# Patient Record
Sex: Female | Born: 1990 | Race: Black or African American | Hispanic: No | Marital: Single | Smoking: Never smoker
Health system: Southern US, Community
[De-identification: ages and names within clinical notes are randomized; demographics above are authoritative.]

## PROBLEM LIST (undated history)

## (undated) DIAGNOSIS — J45909 Unspecified asthma, uncomplicated: Secondary | ICD-10-CM

## (undated) HISTORY — PX: ESOPHAGEAL RECONSTRUCTION: SHX1527

## (undated) HISTORY — PX: BACK SURGERY: SHX140

## (undated) HISTORY — DX: Unspecified asthma, uncomplicated: J45.909

---

## 2012-01-29 ENCOUNTER — Encounter (HOSPITAL_COMMUNITY): Payer: Self-pay | Admitting: *Deleted

## 2012-01-29 ENCOUNTER — Emergency Department (HOSPITAL_COMMUNITY)
Admission: EM | Admit: 2012-01-29 | Discharge: 2012-01-29 | Disposition: A | Discharge status: Home / Self Care | Payer: BC Managed Care – PPO | Attending: Emergency Medicine | Admitting: Emergency Medicine

## 2012-01-29 ENCOUNTER — Emergency Department (HOSPITAL_COMMUNITY): Payer: BC Managed Care – PPO

## 2012-01-29 DIAGNOSIS — R059 Cough, unspecified: Secondary | ICD-10-CM | POA: Insufficient documentation

## 2012-01-29 DIAGNOSIS — R0602 Shortness of breath: Secondary | ICD-10-CM | POA: Insufficient documentation

## 2012-01-29 DIAGNOSIS — B349 Viral infection, unspecified: Secondary | ICD-10-CM

## 2012-01-29 DIAGNOSIS — R109 Unspecified abdominal pain: Secondary | ICD-10-CM | POA: Insufficient documentation

## 2012-01-29 DIAGNOSIS — R197 Diarrhea, unspecified: Secondary | ICD-10-CM | POA: Insufficient documentation

## 2012-01-29 DIAGNOSIS — R05 Cough: Secondary | ICD-10-CM | POA: Insufficient documentation

## 2012-01-29 DIAGNOSIS — R000 Tachycardia, unspecified: Secondary | ICD-10-CM | POA: Insufficient documentation

## 2012-01-29 DIAGNOSIS — R079 Chest pain, unspecified: Secondary | ICD-10-CM | POA: Insufficient documentation

## 2012-01-29 DIAGNOSIS — B9789 Other viral agents as the cause of diseases classified elsewhere: Secondary | ICD-10-CM | POA: Insufficient documentation

## 2012-01-29 DIAGNOSIS — R1110 Vomiting, unspecified: Secondary | ICD-10-CM | POA: Insufficient documentation

## 2012-01-29 LAB — URINE MICROSCOPIC-ADD ON

## 2012-01-29 LAB — URINALYSIS, ROUTINE W REFLEX MICROSCOPIC
Bilirubin Urine: NEGATIVE
Glucose, UA: NEGATIVE mg/dL
Hgb urine dipstick: NEGATIVE
Ketones, ur: 40 mg/dL — AB
Leukocytes, UA: NEGATIVE
Protein, ur: 30 mg/dL — AB
pH: 6.5 (ref 5.0–8.0)

## 2012-01-29 LAB — POCT I-STAT, CHEM 8
Creatinine, Ser: 0.5 mg/dL (ref 0.50–1.10)
HCT: 40 % (ref 36.0–46.0)
Hemoglobin: 13.6 g/dL (ref 12.0–15.0)
Potassium: 4 mEq/L (ref 3.5–5.1)
Sodium: 142 mEq/L (ref 135–145)
TCO2: 21 mmol/L (ref 0–100)

## 2012-01-29 MED ORDER — ONDANSETRON HCL 4 MG/2ML IJ SOLN
4.0000 mg | Freq: Once | INTRAMUSCULAR | Status: AC
  Administered 2012-01-29: 4 mg via INTRAVENOUS
  Filled 2012-01-29: qty 2

## 2012-01-29 MED ORDER — KETOROLAC TROMETHAMINE 30 MG/ML IJ SOLN
30.0000 mg | Freq: Once | INTRAMUSCULAR | Status: AC
  Administered 2012-01-29: 30 mg via INTRAVENOUS
  Filled 2012-01-29: qty 1

## 2012-01-29 MED ORDER — SODIUM CHLORIDE 0.9 % IV BOLUS (SEPSIS)
1000.0000 mL | Freq: Once | INTRAVENOUS | Status: AC
  Administered 2012-01-29: 1000 mL via INTRAVENOUS

## 2012-01-29 MED ORDER — PROMETHAZINE HCL 25 MG PO TABS: 25.0000 mg | ORAL_TABLET | Freq: Four times a day (QID) | ORAL | Status: DC | PRN

## 2012-01-29 MED ORDER — SODIUM CHLORIDE 0.9 % IV SOLN
INTRAVENOUS | Status: DC
  Administered 2012-01-29: 23:00:00 via INTRAVENOUS

## 2012-01-29 MED ORDER — PROMETHAZINE HCL 25 MG/ML IJ SOLN
12.5000 mg | INTRAMUSCULAR | Status: AC
  Administered 2012-01-29: 12.5 mg via INTRAVENOUS
  Filled 2012-01-29: qty 1

## 2012-01-29 NOTE — ED Notes (Signed)
Unable to obtain IV access x2 by 2 different RN's. Paged IV team

## 2012-01-29 NOTE — ED Notes (Signed)
IV team at bedside 

## 2012-01-29 NOTE — ED Notes (Signed)
Pt states feeling somewhat better.  NP notified.

## 2012-01-29 NOTE — ED Notes (Signed)
Phenergan ordered from pharmacy 

## 2012-01-29 NOTE — Discharge Instructions (Signed)
Please read the instructions below.  You were seen in the emergency department tonight for your nausea, vomiting and diarrhea.  The likely source of your symptoms is a viral illness.  As a result of these symptoms you were quite dehydrated.  After fluids and medications for nausea you admit to feeling better.  We're prescribing medication for nausea.  Take as directed to allow you to keep well-hydrated.  When you feel you can try the eat again refer to the BR[facility] diet instructions If the diarrhea returns and persists you can get over-the-counter Immodium and take as directed.  Return for worsening symptoms otherwise we have provided the primary care referrals as discussed.  You can arrange follow up with a primary care physician of your choice.   Viral Infections A viral infection can be caused by different types of viruses.Most viral infections are not serious and resolve on their own. However, some infections may cause severe symptoms and may lead to further complications. SYMPTOMS Viruses can frequently cause:  Minor sore throat.   Aches and pains.   Headaches.   Runny nose.   Different types of rashes.   Watery eyes.   Tiredness.   Cough.   Loss of appetite.   Gastrointestinal infections, resulting in nausea, vomiting, and diarrhea.  These symptoms do not respond to antibiotics because the infection is not caused by bacteria. However, you might catch a bacterial infection following the viral infection. This is sometimes called a "superinfection." Symptoms of such a bacterial infection may include:  Worsening sore throat with pus and difficulty swallowing.   Swollen neck glands.   Chills and a high or persistent fever.   Severe headache.   Tenderness over the sinuses.   Persistent overall ill feeling (malaise), muscle aches, and tiredness (fatigue).   Persistent cough.   Yellow, green, or brown mucus production with coughing.  HOME CARE INSTRUCTIONS   Only take  over-the-counter or prescription medicines for pain, discomfort, diarrhea, or fever as directed by your caregiver.   Drink enough water and fluids to keep your urine clear or pale yellow. Sports drinks can provide valuable electrolytes, sugars, and hydration.   Get plenty of rest and maintain proper nutrition. Soups and broths with crackers or rice are fine.  SEEK IMMEDIATE MEDICAL CARE IF:   You have severe headaches, shortness of breath, chest pain, neck pain, or an unusual rash.   You have uncontrolled vomiting, diarrhea, or you are unable to keep down fluids.   You or your child has an oral temperature above 102 F (38.9 C), not controlled by medicine.   Your baby is older than 3 months with a rectal temperature of 102 F (38.9 C) or higher.   Your baby is 5 months old or younger with a rectal temperature of 100.4 F (38 C) or higher.  MAKE SURE YOU:   Understand these instructions.   Will watch your condition.   Will get help right away if you are not doing well or get worse.  Document Released: 07/25/2005 Document Revised: 10/04/2011 Document Reviewed: 02/19/2011 [facility] Patient Information 2012 [place], [place].Viral Infections A viral infection can be caused by different types of viruses.Most viral infections are not serious and resolve on their own. However, some infections may cause severe symptoms and may lead to further complications. SYMPTOMS Viruses can frequently cause:  Minor sore throat.   Aches and pains.   Headaches.   Runny nose.   Different types of rashes.   Watery eyes.  Tiredness.   Cough.   Loss of appetite.   Gastrointestinal infections, resulting in nausea, vomiting, and diarrhea.  These symptoms do not respond to antibiotics because the infection is not caused by bacteria. However, you might catch a bacterial infection following the viral infection. This is sometimes called a "superinfection." Symptoms of such a bacterial infection may  include:  Worsening sore throat with pus and difficulty swallowing.   Swollen neck glands.   Chills and a high or persistent fever.   Severe headache.   Tenderness over the sinuses.   Persistent overall ill feeling (malaise), muscle aches, and tiredness (fatigue).   Persistent cough.   Yellow, green, or brown mucus production with coughing.  HOME CARE INSTRUCTIONS   Only take over-the-counter or prescription medicines for pain, discomfort, diarrhea, or fever as directed by your caregiver.   Drink enough water and fluids to keep your urine clear or pale yellow. Sports drinks can provide valuable electrolytes, sugars, and hydration.   Get plenty of rest and maintain proper nutrition. Soups and broths with crackers or rice are fine.  SEEK IMMEDIATE MEDICAL CARE IF:   You have severe headaches, shortness of breath, chest pain, neck pain, or an unusual rash.   You have uncontrolled vomiting, diarrhea, or you are unable to keep down fluids.   You or your child has an oral temperature above 102 F (38.9 C), not controlled by medicine.   Your baby is older than 3 months with a rectal temperature of 102 F (38.9 C) or higher.   Your baby is 45 months old or younger with a rectal temperature of 100.4 F (38 C) or higher.  MAKE SURE YOU:   Understand these instructions.   Will watch your condition.   Will get help right away if you are not doing well or get worse.  Document Released: 07/25/2005 Document Revised: 10/04/2011 Document Reviewed: 02/19/2011 [facility] Patient Information 2012 [place], [place].

## 2012-01-29 NOTE — ED Notes (Signed)
Lab at bedside.  NA to bedside to help pt to RR.

## 2012-01-29 NOTE — ED Notes (Signed)
IV team aware 

## 2012-01-29 NOTE — ED Notes (Signed)
Pt reports sob and cough x 2 days, denies fevers.

## 2012-01-29 NOTE — ED Notes (Signed)
MD at bedside. 

## 2012-01-29 NOTE — ED Notes (Signed)
Pt ambulated to and from rr with steady gait. 

## 2012-01-29 NOTE — ED Provider Notes (Signed)
History     CSN: [id number]  Arrival date & time 01/29/12  1705   First MD Initiated Contact with Patient 01/29/12 1756      Chief Complaint  Patient presents with  . Shortness of Breath  . Cough    (Consider location/radiation/quality/duration/timing/severity/associated sxs/prior treatment) HPI Comments: Patient presents today with chest pain and shortness of breath.  This is been going for 2 days.  Patient is notably also had vomiting and diarrhea the last 2 days.  Family has had similar symptoms.  She denies any fevers.  She's had some mild abdominal pain.  No dysuria.  No abdominal surgeries.  No specific inciting or relieving factors.  Cough is not productive.  She has minimal cough  Patient is a 21 y.o. female presenting with shortness of breath and cough. The history is provided by the patient. No language interpreter was used.  Shortness of Breath  The current episode started yesterday. The onset was gradual. Associated symptoms include chest pain, cough and shortness of breath. Pertinent negatives include no fever.  Cough Associated symptoms include chest pain and shortness of breath. Pertinent negatives include no chills, no headaches and no eye redness.    History reviewed. No pertinent past medical history.  Past Surgical History  Procedure Date  . Esophageal reconstruction   . Back surgery     History reviewed. No pertinent family history.  History  Substance Use Topics  . Smoking status: Never Smoker   . Smokeless tobacco: Not on file  . Alcohol Use: Yes     occ    OB History    Grav Para Term Preterm Abortions TAB SAB Ect Mult Living                  Review of Systems  Constitutional: Negative.  Negative for fever and chills.  HENT: Negative.   Eyes: Negative.  Negative for discharge and redness.  Respiratory: Positive for cough and shortness of breath.   Cardiovascular: Positive for chest pain.  Gastrointestinal: Positive for nausea, vomiting,  abdominal pain and diarrhea.  Genitourinary: Negative.  Negative for dysuria and vaginal discharge.  Musculoskeletal: Negative.  Negative for back pain.  Skin: Negative.  Negative for color change and rash.  Neurological: Negative.  Negative for syncope and headaches.  Hematological: Negative.  Negative for adenopathy.  Psychiatric/Behavioral: Negative.  Negative for confusion.  All other systems reviewed and are negative.    Allergies  Review of patient's allergies indicates no known allergies.  Home Medications  No current outpatient prescriptions on file.  BP 121/86  Pulse 116  Temp(Src) 98.2 F (36.8 C) (Oral)  Resp 18  SpO2 100%  Physical Exam  Nursing note and vitals reviewed. Constitutional: She is oriented to person, place, and time. She appears well-developed and well-nourished.  Non-toxic appearance. She does not have a sickly appearance.  HENT:  Head: Normocephalic and atraumatic.  Eyes: Conjunctivae, EOM and lids are normal. Pupils are equal, round, and reactive to light. No scleral icterus.  Neck: Trachea normal and normal range of motion. Neck supple.  Cardiovascular: Regular rhythm and normal heart sounds.  Tachycardia present.   Pulmonary/Chest: Effort normal and breath sounds normal. No respiratory distress. She has no wheezes. She has no rales.  Abdominal: Soft. Normal appearance. There is no tenderness. There is no rebound, no guarding and no CVA tenderness.  Musculoskeletal: Normal range of motion.  Neurological: She is alert and oriented to person, place, and time. She has normal strength.  Skin: Skin is warm, dry and intact. No rash noted.  Psychiatric: She has a normal mood and affect. Her behavior is normal. Judgment and thought content normal.    ED Course  Procedures (including critical care time)  Dg Chest 2 View  01/29/2012  *RADIOLOGY REPORT*  Clinical Data: Chest pain and shortness of breath.  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  There are dense costochondral calcifications noted on the left side of uncertain significance or etiology.  It may be a stress-related process due to the scoliosis and subsequent surgery.  The cardiac silhouette, mediastinal and hilar contours are normal and the lungs are clear.  No pleural effusion.  IMPRESSION:  1.  No acute cardiopulmonary findings.  Original Report Authenticated By: P. Loralie Champagne, M.D.      MDM  Patient appears to have a likely viral syndrome based on her nausea, vomiting, diarrhea as well as cough and chest pain.  Her chest x-ray is negative and does not show any pneumonia.  We'll treat her with antiemetics and IV fluids to try and decrease her heart rate for rehydration.  Anticipate that it once her heart rate decreases and she hopefully symptomatically improve she'll be able to be discharged home.        Nat Christen, MD 01/29/12 [phone]

## 2017-11-29 ENCOUNTER — Emergency Department (HOSPITAL_COMMUNITY): Payer: 59

## 2017-11-29 ENCOUNTER — Encounter (HOSPITAL_COMMUNITY): Payer: Self-pay | Admitting: *Deleted

## 2017-11-29 ENCOUNTER — Other Ambulatory Visit: Payer: Self-pay

## 2017-11-29 ENCOUNTER — Observation Stay (HOSPITAL_COMMUNITY)
Admission: EM | Admit: 2017-11-29 | Discharge: 2017-12-01 | Disposition: A | Discharge status: Home / Self Care | Payer: 59 | Attending: Family Medicine | Admitting: Family Medicine

## 2017-11-29 DIAGNOSIS — J189 Pneumonia, unspecified organism: Secondary | ICD-10-CM | POA: Diagnosis not present

## 2017-11-29 DIAGNOSIS — R030 Elevated blood-pressure reading, without diagnosis of hypertension: Secondary | ICD-10-CM | POA: Diagnosis not present

## 2017-11-29 DIAGNOSIS — M419 Scoliosis, unspecified: Secondary | ICD-10-CM | POA: Diagnosis not present

## 2017-11-29 DIAGNOSIS — J181 Lobar pneumonia, unspecified organism: Secondary | ICD-10-CM

## 2017-11-29 DIAGNOSIS — R079 Chest pain, unspecified: Secondary | ICD-10-CM | POA: Diagnosis present

## 2017-11-29 DIAGNOSIS — R0682 Tachypnea, not elsewhere classified: Secondary | ICD-10-CM | POA: Insufficient documentation

## 2017-11-29 DIAGNOSIS — K5939 Other megacolon: Secondary | ICD-10-CM | POA: Insufficient documentation

## 2017-11-29 DIAGNOSIS — Z79899 Other long term (current) drug therapy: Secondary | ICD-10-CM | POA: Insufficient documentation

## 2017-11-29 DIAGNOSIS — Z981 Arthrodesis status: Secondary | ICD-10-CM | POA: Diagnosis not present

## 2017-11-29 LAB — BASIC METABOLIC PANEL
ANION GAP: 12 (ref 5–15)
BUN: 15 mg/dL (ref 6–20)
CALCIUM: 8.7 mg/dL — AB (ref 8.9–10.3)
CO2: 22 mmol/L (ref 22–32)
Chloride: 104 mmol/L (ref 101–111)
Creatinine, Ser: 0.69 mg/dL (ref 0.44–1.00)
GFR calc Af Amer: >60 mL/min (ref >60)
GFR calc non Af Amer: >60 mL/min (ref >60)
GLUCOSE: 90 mg/dL (ref 65–99)
Potassium: 3.7 mmol/L (ref 3.5–5.1)
Sodium: 138 mmol/L (ref 135–145)

## 2017-11-29 LAB — CBC
HEMATOCRIT: 35.6 % — AB (ref 36.0–46.0)
HEMOGLOBIN: 12 g/dL (ref 12.0–15.0)
MCH: 29.3 pg (ref 26.0–34.0)
MCHC: 33.7 g/dL (ref 30.0–36.0)
MCV: 86.8 fL (ref 78.0–100.0)
Platelets: 253 10*3/uL (ref 150–400)
RBC: 4.1 MIL/uL (ref 3.87–5.11)
RDW: 13.4 % (ref 11.5–15.5)
WBC: 14.9 10*3/uL — ABNORMAL HIGH (ref 4.0–10.5)

## 2017-11-29 LAB — I-STAT TROPONIN, ED: TROPONIN I, POC: 0 ng/mL (ref 0.00–0.08)

## 2017-11-29 MED ORDER — DEXTROSE 5 % IV SOLN
500.0000 mg | Freq: Once | INTRAVENOUS | Status: AC
  Administered 2017-11-29: 500 mg via INTRAVENOUS
  Filled 2017-11-29: qty 500

## 2017-11-29 MED ORDER — FENTANYL CITRATE (PF) 100 MCG/2ML IJ SOLN
50.0000 ug | Freq: Once | INTRAMUSCULAR | Status: AC
  Administered 2017-11-29: 50 ug via INTRAVENOUS
  Filled 2017-11-29: qty 2

## 2017-11-29 MED ORDER — DEXTROSE 5 % IV SOLN
1.0000 g | Freq: Once | INTRAVENOUS | Status: AC
  Administered 2017-11-29: 1 g via INTRAVENOUS
  Filled 2017-11-29: qty 10

## 2017-11-29 MED ORDER — IOPAMIDOL (ISOVUE-370) INJECTION 76%
INTRAVENOUS | Status: AC
  Administered 2017-11-29: 100 mL
  Filled 2017-11-29: qty 100

## 2017-11-29 MED ORDER — SODIUM CHLORIDE 0.9 % IV BOLUS (SEPSIS)
1000.0000 mL | Freq: Once | INTRAVENOUS | Status: AC
  Administered 2017-11-29: 1000 mL via INTRAVENOUS

## 2017-11-29 NOTE — ED Provider Notes (Signed)
[facility] EMERGENCY DEPARTMENT Provider Note   CSN: [id number] Arrival date & time: 11/29/17  1651     History   Chief Complaint Chief Complaint  Patient presents with  . Chest Pain  . Back Pain    HPI Olivia Charles is a 27 y.o. female.  The history is provided by the patient and medical records. No language interpreter was used.  Chest Pain   This is a new problem. The current episode started more than 1 week ago. The problem occurs constantly. The problem has been gradually worsening. The pain is associated with breathing and coughing. The pain is present in the substernal region. The pain is at a severity of 7/10. The pain is moderate. The quality of the pain is described as pleuritic, sharp and dull. The pain does not radiate. Duration of episode(s) is 1 week. The symptoms are aggravated by deep breathing. Associated symptoms include cough, malaise/fatigue and shortness of breath. Pertinent negatives include no abdominal pain, no back pain, no diaphoresis, no dizziness, no exertional chest pressure, no fever, no headaches, no irregular heartbeat, no lower extremity edema, no nausea, no palpitations, no sputum production, no syncope, no vomiting and no weakness. She has tried nothing for the symptoms. The treatment provided no relief. Past medical history comments: esophageal surgery    History reviewed. No pertinent past medical history.  There are no active problems to display for this patient.   Past Surgical History:  Procedure Laterality Date  . BACK SURGERY    . ESOPHAGEAL RECONSTRUCTION      OB History    No data available       Home Medications    Prior to Admission medications   Medication Sig Start Date End Date Taking? Authorizing Provider  ibuprofen (ADVIL,MOTRIN) 200 MG tablet Take 400 mg by mouth every 6 (six) hours as needed for headache (pain).   Yes [provider]  MedroxyPROGESTERone Acetate 150 MG/ML SUSY Inject 150  mg into the muscle every 3 (three) months. Next injection due 12/03/17 09/16/17  Yes [provider]  omeprazole (PRILOSEC) 20 MG capsule Take 20 mg by mouth daily. 11/21/17  Yes [provider]  Tetrahydrozoline HCl (VISINE OP) Place 1 drop into both eyes daily as needed (irritation).   Yes [provider]  valACYclovir (VALTREX) 500 MG tablet Take 500 mg by mouth daily.   Yes [provider]    Family History No family history on file.  Social History Social History   Tobacco Use  . Smoking status: Never Smoker  Substance Use Topics  . Alcohol use: Yes    Comment: occ  . Drug use: Not on file     Allergies   Patient has no known allergies.   Review of Systems Review of Systems  Constitutional: Positive for chills and malaise/fatigue. Negative for diaphoresis, fatigue and fever.  HENT: Negative for congestion.   Eyes: Negative for visual disturbance.  Respiratory: Positive for cough and shortness of breath. Negative for sputum production, choking, chest tightness, wheezing and stridor.   Cardiovascular: Positive for chest pain. Negative for palpitations, leg swelling and syncope.  Gastrointestinal: Negative for abdominal pain, nausea and vomiting.  Genitourinary: Negative for dysuria and flank pain.  Musculoskeletal: Negative for back pain and neck pain.  Skin: Negative for rash and wound.  Neurological: Negative for dizziness, weakness, light-headedness and headaches.  Psychiatric/Behavioral: Negative for agitation.  All other systems reviewed and are negative.    Physical Exam Updated Vital  Signs BP (!) 131/99 (BP Location: Right Arm)   Pulse 98   Temp 98.7 F (37.1 C) (Oral)   Resp 16   Ht 4\' 7"  (1.397 m)   Wt 88 kg (194 lb)   SpO2 99%   BMI 45.09 kg/m   Physical Exam  Constitutional: She is oriented to person, place, and time. She appears well-developed and well-nourished.  Non-toxic appearance. She does not appear ill. No  distress.  HENT:  Head: Normocephalic.  Mouth/Throat: Oropharynx is clear and moist. No oropharyngeal exudate.  Eyes: Conjunctivae and EOM are normal. Pupils are equal, round, and reactive to light.  Neck: Normal range of motion. Neck supple.  Cardiovascular: Intact distal pulses. Tachycardia present.  No murmur heard. Pulmonary/Chest: Effort normal. No stridor. No respiratory distress. She has no wheezes. She has rhonchi. She has no rales. She exhibits no tenderness.  Abdominal: Soft. Bowel sounds are normal. She exhibits no distension. There is no tenderness. There is no guarding.  Musculoskeletal: She exhibits no edema or tenderness.  Neurological: She is alert and oriented to person, place, and time. No sensory deficit. She exhibits normal muscle tone.  Skin: Capillary refill takes less than 2 seconds. No rash noted. She is not diaphoretic. No erythema.  Psychiatric: She has a normal mood and affect.  Nursing note and vitals reviewed.    ED Treatments / Results  Labs (all labs ordered are listed, but only abnormal results are displayed) Labs Reviewed  BASIC METABOLIC PANEL - Abnormal; Notable for the following components:      Result Value   Calcium 8.7 (*)    All other components within normal limits  CBC - Abnormal; Notable for the following components:   WBC 14.9 (*)    HCT 35.6 (*)    All other components within normal limits  CULTURE, BLOOD (ROUTINE X 2)  CULTURE, BLOOD (ROUTINE X 2)  CULTURE, EXPECTORATED SPUTUM-ASSESSMENT  GRAM STAIN  PROCALCITONIN  HIV ANTIBODY (ROUTINE TESTING)  STREP PNEUMONIAE URINARY ANTIGEN  COMPREHENSIVE METABOLIC PANEL  CBC WITH DIFFERENTIAL/PLATELET  LEGIONELLA PNEUMOPHILA SEROGP 1 UR AG  PROCALCITONIN  I-STAT TROPONIN, ED  I-STAT BETA HCG BLOOD, ED ([facility], [facility], [facility] ONLY)  POC URINE PREG, ED    EKG  EKG Interpretation  Date/Time:  Friday November 29 2017 22:25:30 EST Ventricular Rate:  97 PR Interval:    QRS Duration: 86 QT  Interval:  364 QTC Calculation: 463 R Axis:   45 Text Interpretation:  Sinus rhythm Borderline T abnormalities, anterior leads No prior ECG for comparison.  No STEMI Confirmed by Theda Belfastegeler, Chris ([id number]) on 11/30/2017 3:31:25 AM       Radiology Dg Chest 2 View  Result Date: 11/29/2017 CLINICAL DATA:  Chest pain with onset 1 week ago. EXAM: CHEST  2 VIEW COMPARISON:  01/29/2012 FINDINGS: Cardiomediastinal silhouette is normal. Mediastinal contours appear intact. There is no evidence of pleural effusion or pneumothorax. Airspace consolidation in the inferior aspect of the left upper lobe/left middle lobe. Gas soft tissue levels seen on the lateral view. Osseous structures are without acute abnormality. Prior posterior spinal fusion. Soft tissues are grossly normal. IMPRESSION: Airspace consolidation in the mid left thorax with gas soft tissue levels seen on the lateral view. This may represent cavitating pneumonia or other space-occupying lesion. Further evaluation with chest CT with contrast may be considered. Electronically Signed   By: Ted Mcalpineobrinka  Dimitrova M.D.   On: 11/29/2017 17:38   Ct Angio Chest Pe W And/or Wo Contrast  Result Date: 11/29/2017  CLINICAL DATA:  Chest pain, shortness of Breath. Remote history of esophageal reconstruction. EXAM: CT ANGIOGRAPHY CHEST WITH CONTRAST TECHNIQUE: Multidetector CT imaging of the chest was performed using the standard protocol during bolus administration of intravenous contrast. Multiplanar CT image reconstructions and MIPs were obtained to evaluate the vascular anatomy. CONTRAST:  ISOVUE-370 IOPAMIDOL (ISOVUE-370) INJECTION 76% COMPARISON:  None. FINDINGS: Cardiovascular: Heart is normal size. Aorta is normal caliber. No filling defects in the pulmonary arteries to suggest pulmonary emboli. Mediastinum/Nodes: No adenopathy. The esophagus abnormal appearing. The fluid-filled. Possible diverticulum off the distal esophagus, measuring 1.9 cm on image 87.  Esophagus is difficult to follow in the upper thoracic region. Lungs/Pleura: Gas collection noted in the anterior left hemithorax. On the coronal views, this appears to represent colon passing through a very small anterior diaphragmatic defect seen best on sagittal image 83. The air-fluid level seen on chest x-ray were in this herniated colon segment. When comparing to prior chest x-ray 01/29/2012, there appear to be colon within the chest on that study this was difficult to visualize. This presumably represents the esophageal reconstruction reported by the patient. Areas of nodular ground-glass consolidation throughout the left lower lobe compatible with pneumonia or aspiration pneumonia. Right lung is clear. No effusions. Upper Abdomen: Although difficult to see, anterior diaphragmatic noted noted on the left at approximately image 107 of the axial images. No acute findings in the upper abdomen. Musculoskeletal: There is gas tracking from the colon within the upper left hemithorax which tracks superiorly anterior to the left innominate vein and tracks into the left side of the neck. This presumably is part of the esophageal reconstruction with the colon which likely ties into the left side of the cervical esophagus. Severe thoracolumbar scoliosis. Posterior spinal rods noted in the thoracic and visualized lumbar spine. Review of the MIP images confirms the above findings. IMPRESSION: The air-fluid level seen on today's chest x-ray appear to be within colon in the left anterior hemithorax. Gas and stool filled loop of colon extend from the anterior left hemidiaphragm superiorly to the left apex. In retrospect, this herniated colon was likely present in the left chest on remote chest x-ray from 01/29/2012. In addition, gas extends from the anterior left hemithorax and the herniated colon superiorly in the left side of the neck. The these findings presumably represent the esophageal reconstruction with a colon  segment and layering food material dependently in the dilated colon. Abnormal appearance of the esophagus which appears thick walled in some areas and is fluid-filled and dilated throughout its course in the chest. Distally, there is fluid adjacent to the distal esophagus measuring 1.9 cm which may reflect a distal esophageal diverticulum. Consolidation noted in the left lower lobe compatible with pneumonia. Cannot exclude aspiration pneumonia. These results were called by telephone at the time of interpretation on 11/29/2017 at 11:12 pm to Dr. Lynden Oxford , who verbally acknowledged these results. Electronically Signed   By: Charlett Nose M.D.   On: 11/29/2017 23:12    Procedures Procedures (including critical care time)  Medications Ordered in ED Medications  pantoprazole (PROTONIX) EC tablet 40 mg (not administered)  tetrahydrozoline 0.05 % ophthalmic solution 2 drop (not administered)  valACYclovir (VALTREX) tablet 500 mg (not administered)  enoxaparin (LOVENOX) injection 40 mg (not administered)  sodium chloride flush (NS) 0.9 % injection 3 mL (3 mLs Intravenous Given 11/30/17 0100)  sodium chloride flush (NS) 0.9 % injection 3 mL (not administered)  0.9 %  sodium chloride infusion (not administered)  acetaminophen (TYLENOL) tablet 650 mg (not administered)    Or  acetaminophen (TYLENOL) suppository 650 mg (not administered)  HYDROcodone-acetaminophen (NORCO/VICODIN) 5-325 MG per tablet 1-2 tablet (not administered)  senna-docusate (Senokot-S) tablet 1 tablet (not administered)  bisacodyl (DULCOLAX) EC tablet 5 mg (not administered)  ondansetron (ZOFRAN) tablet 4 mg (not administered)    Or  ondansetron (ZOFRAN) injection 4 mg (not administered)  cefTRIAXone (ROCEPHIN) 1 g in dextrose 5 % 50 mL IVPB (not administered)  azithromycin (ZITHROMAX) 500 mg in dextrose 5 % 250 mL IVPB (not administered)  famotidine (PEPCID) IVPB 20 mg premix (20 mg Intravenous New Bag/Given 11/30/17 0326)    metroNIDAZOLE (FLAGYL) IVPB 500 mg (0 mg Intravenous Stopped 11/30/17 0250)  fentaNYL (SUBLIMAZE) injection 50 mcg (not administered)  hydrALAZINE (APRESOLINE) injection 10 mg (not administered)  sodium chloride 0.9 % bolus 1,000 mL (1,000 mLs Intravenous New Bag/Given 11/29/17 2204)  fentaNYL (SUBLIMAZE) injection 50 mcg (50 mcg Intravenous Given 11/29/17 2207)  iopamidol (ISOVUE-370) 76 % injection (100 mLs  Contrast Given 11/29/17 2211)  cefTRIAXone (ROCEPHIN) 1 g in dextrose 5 % 50 mL IVPB (0 g Intravenous Stopped 11/30/17 0028)  azithromycin (ZITHROMAX) 500 mg in dextrose 5 % 250 mL IVPB (0 mg Intravenous Stopped 11/30/17 0100)     Initial Impression / Assessment and Plan / ED Course  I have reviewed the triage vital signs and the nursing notes.  Pertinent labs & imaging results that were available during my care of the patient were reviewed by me and considered in my medical decision making (see chart for details).     Olivia Charles is a 27 y.o. female with a past medical history significant for scoliosis status post back surgery, esophageal reconstruction status post surgery, and chronic back pain who presents with chest pain.  Patient reports that she is been having mid sternal chest pain for the last week.  She reports that she went to urgent care several days ago and was started on steroids and omeprazole.  Patient was instructed to follow-up with PCP and observe return precautions.  Patient says she is continued to have worsening chest pain and cough and chills.  On arrival, patient was found to be tachycardic.  Patient had coarse breath sounds on exam.  Patient's chest was nontender to palpation.  EKG showed no STEMI.  Chest x-ray showed concern for cavitary pneumonia versus other mass.  CT was obtained to rule out pulmonary embolism versus atypical pneumonia.  CT scan showed pneumonia but also the patient's prior esophageal surgery.  Do not feel patient has a problem with esophageal  surgery at this time however patient was started on antibiotics.  Blood cultures obtained.  Next  Given the patient's atypical anatomy and ongoing pneumonia, patient will be admitted for further management.  Patient admitted in stable condition for further management of pneumonia.  If patient develops any severe worsening or other problems, anticipate speaking with cardiothoracic surgery to evaluate the patient's esophagus.   Final Clinical Impressions(s) / ED Diagnoses   Final diagnoses:  Community acquired pneumonia of left lower lobe of lung (HCC)     Clinical Impression: 1. Community acquired pneumonia of left lower lobe of lung (HCC)     Disposition: Admit  This note was prepared with assistance of Conservation officer, historic buildings. Occasional wrong-word or sound-a-like substitutions may have occurred due to the inherent limitations of voice recognition software.      Tahje Borawski, Canary Brim, MD 11/30/17 [phone]

## 2017-11-29 NOTE — ED Triage Notes (Signed)
Pt c/o mid CP onset x 1 wk that was evaluated at UC last wk with rx of omeprazole, pt reports continued pain that radiates to mid upper back and SOB, pt denies n/v/d, pt denies htn with BP elevated in triage 141/104 R arm, 197/104 L arm, pt A&O x4

## 2017-11-30 DIAGNOSIS — J181 Lobar pneumonia, unspecified organism: Secondary | ICD-10-CM

## 2017-11-30 DIAGNOSIS — R079 Chest pain, unspecified: Secondary | ICD-10-CM

## 2017-11-30 DIAGNOSIS — R030 Elevated blood-pressure reading, without diagnosis of hypertension: Secondary | ICD-10-CM

## 2017-11-30 LAB — CBC WITH DIFFERENTIAL/PLATELET
BASOS ABS: 0 10*3/uL (ref 0.0–0.1)
Basophils Relative: 0 %
EOS ABS: 0.1 10*3/uL (ref 0.0–0.7)
Eosinophils Relative: 1 %
HCT: 33.7 % — ABNORMAL LOW (ref 36.0–46.0)
Hemoglobin: 11.5 g/dL — ABNORMAL LOW (ref 12.0–15.0)
LYMPHS PCT: 27 %
Lymphs Abs: 3.3 10*3/uL (ref 0.7–4.0)
MCH: 29.9 pg (ref 26.0–34.0)
MCHC: 34.1 g/dL (ref 30.0–36.0)
MCV: 87.5 fL (ref 78.0–100.0)
Monocytes Absolute: 0.4 10*3/uL (ref 0.1–1.0)
Monocytes Relative: 3 %
Neutro Abs: 8.2 10*3/uL — ABNORMAL HIGH (ref 1.7–7.7)
Neutrophils Relative %: 69 %
PLATELETS: 256 10*3/uL (ref 150–400)
RBC: 3.85 MIL/uL — AB (ref 3.87–5.11)
RDW: 13.8 % (ref 11.5–15.5)
WBC: 11.9 10*3/uL — AB (ref 4.0–10.5)

## 2017-11-30 LAB — COMPREHENSIVE METABOLIC PANEL
ALT: 13 U/L — ABNORMAL LOW (ref 14–54)
AST: 16 U/L (ref 15–41)
Albumin: 3.3 g/dL — ABNORMAL LOW (ref 3.5–5.0)
Alkaline Phosphatase: 49 U/L (ref 38–126)
Anion gap: 11 (ref 5–15)
BILIRUBIN TOTAL: 0.5 mg/dL (ref 0.3–1.2)
BUN: 11 mg/dL (ref 6–20)
CO2: 22 mmol/L (ref 22–32)
Calcium: 8.5 mg/dL — ABNORMAL LOW (ref 8.9–10.3)
Chloride: 104 mmol/L (ref 101–111)
Creatinine, Ser: 0.6 mg/dL (ref 0.44–1.00)
GFR calc Af Amer: >60 mL/min (ref >60)
Glucose, Bld: 84 mg/dL (ref 65–99)
POTASSIUM: 3.7 mmol/L (ref 3.5–5.1)
Sodium: 137 mmol/L (ref 135–145)
TOTAL PROTEIN: 5.8 g/dL — AB (ref 6.5–8.1)

## 2017-11-30 LAB — PROCALCITONIN: Procalcitonin: <0.1 ng/mL

## 2017-11-30 LAB — STREP PNEUMONIAE URINARY ANTIGEN: Strep Pneumo Urinary Antigen: NEGATIVE

## 2017-11-30 MED ORDER — METRONIDAZOLE IN NACL 5-0.79 MG/ML-% IV SOLN
500.0000 mg | Freq: Three times a day (TID) | INTRAVENOUS | Status: DC
  Administered 2017-11-30 (×2): 500 mg via INTRAVENOUS
  Filled 2017-11-30 (×3): qty 100

## 2017-11-30 MED ORDER — DEXTROSE 5 % IV SOLN
1.0000 g | INTRAVENOUS | Status: DC
  Administered 2017-11-30: 1 g via INTRAVENOUS
  Filled 2017-11-30: qty 10

## 2017-11-30 MED ORDER — ACETAMINOPHEN 325 MG PO TABS
650.0000 mg | ORAL_TABLET | Freq: Four times a day (QID) | ORAL | Status: DC | PRN
  Administered 2017-11-30: 650 mg via ORAL
  Filled 2017-11-30 (×2): qty 2

## 2017-11-30 MED ORDER — ALBUTEROL SULFATE (2.5 MG/3ML) 0.083% IN NEBU
2.5000 mg | INHALATION_SOLUTION | Freq: Once | RESPIRATORY_TRACT | Status: AC
  Administered 2017-11-30: 2.5 mg via RESPIRATORY_TRACT
  Filled 2017-11-30: qty 3

## 2017-11-30 MED ORDER — SODIUM CHLORIDE 0.9% FLUSH
3.0000 mL | Freq: Two times a day (BID) | INTRAVENOUS | Status: DC
  Administered 2017-11-30 – 2017-12-01 (×3): 3 mL via INTRAVENOUS

## 2017-11-30 MED ORDER — HYDROCODONE-ACETAMINOPHEN 5-325 MG PO TABS
1.0000 | ORAL_TABLET | ORAL | Status: DC | PRN
  Administered 2017-12-01: 2 via ORAL
  Filled 2017-11-30: qty 2
  Filled 2017-11-30: qty 1

## 2017-11-30 MED ORDER — FAMOTIDINE 20 MG PO TABS
20.0000 mg | ORAL_TABLET | Freq: Two times a day (BID) | ORAL | Status: DC
  Administered 2017-11-30 – 2017-12-01 (×2): 20 mg via ORAL
  Filled 2017-11-30 (×3): qty 1

## 2017-11-30 MED ORDER — BISACODYL 5 MG PO TBEC: 5.0000 mg | DELAYED_RELEASE_TABLET | Freq: Every day | ORAL | Status: DC | PRN

## 2017-11-30 MED ORDER — FENTANYL CITRATE (PF) 100 MCG/2ML IJ SOLN: 50.0000 ug | INTRAMUSCULAR | Status: DC | PRN

## 2017-11-30 MED ORDER — HYDRALAZINE HCL 20 MG/ML IJ SOLN: 10.0000 mg | INTRAMUSCULAR | Status: DC | PRN

## 2017-11-30 MED ORDER — PANTOPRAZOLE SODIUM 40 MG PO TBEC
40.0000 mg | DELAYED_RELEASE_TABLET | Freq: Every day | ORAL | Status: DC
  Administered 2017-11-30 – 2017-12-01 (×2): 40 mg via ORAL
  Filled 2017-11-30 (×2): qty 1

## 2017-11-30 MED ORDER — ACETAMINOPHEN 650 MG RE SUPP: 650.0000 mg | Freq: Four times a day (QID) | RECTAL | Status: DC | PRN

## 2017-11-30 MED ORDER — SODIUM CHLORIDE 0.9% FLUSH: 3.0000 mL | INTRAVENOUS | Status: DC | PRN

## 2017-11-30 MED ORDER — ONDANSETRON HCL 4 MG PO TABS: 4.0000 mg | ORAL_TABLET | Freq: Four times a day (QID) | ORAL | Status: DC | PRN

## 2017-11-30 MED ORDER — AZITHROMYCIN 500 MG PO TABS
500.0000 mg | ORAL_TABLET | Freq: Every day | ORAL | Status: DC
  Administered 2017-11-30: 500 mg via ORAL
  Filled 2017-11-30 (×2): qty 1

## 2017-11-30 MED ORDER — GUAIFENESIN ER 600 MG PO TB12
600.0000 mg | ORAL_TABLET | Freq: Two times a day (BID) | ORAL | Status: DC
  Administered 2017-11-30 – 2017-12-01 (×3): 600 mg via ORAL
  Filled 2017-11-30 (×3): qty 1

## 2017-11-30 MED ORDER — METRONIDAZOLE 500 MG PO TABS
500.0000 mg | ORAL_TABLET | Freq: Three times a day (TID) | ORAL | Status: DC
  Administered 2017-11-30 – 2017-12-01 (×3): 500 mg via ORAL
  Filled 2017-11-30 (×3): qty 1

## 2017-11-30 MED ORDER — TETRAHYDROZOLINE HCL 0.05 % OP SOLN
2.0000 [drp] | Freq: Every day | OPHTHALMIC | Status: DC | PRN
Start: 2017-11-30 — End: 2017-12-01

## 2017-11-30 MED ORDER — ONDANSETRON HCL 4 MG/2ML IJ SOLN: 4.0000 mg | Freq: Four times a day (QID) | INTRAMUSCULAR | Status: DC | PRN

## 2017-11-30 MED ORDER — VALACYCLOVIR HCL 500 MG PO TABS
500.0000 mg | ORAL_TABLET | Freq: Every day | ORAL | Status: DC
  Administered 2017-11-30 – 2017-12-01 (×2): 500 mg via ORAL
  Filled 2017-11-30 (×2): qty 1

## 2017-11-30 MED ORDER — ENOXAPARIN SODIUM 40 MG/0.4ML ~~LOC~~ SOLN
40.0000 mg | Freq: Every day | SUBCUTANEOUS | Status: DC
  Administered 2017-11-30 – 2017-12-01 (×2): 40 mg via SUBCUTANEOUS
  Filled 2017-11-30 (×2): qty 0.4

## 2017-11-30 MED ORDER — FAMOTIDINE IN NACL 20-0.9 MG/50ML-% IV SOLN
20.0000 mg | Freq: Two times a day (BID) | INTRAVENOUS | Status: DC
  Administered 2017-11-30 (×2): 20 mg via INTRAVENOUS
  Filled 2017-11-30 (×2): qty 50

## 2017-11-30 MED ORDER — ALBUTEROL SULFATE (2.5 MG/3ML) 0.083% IN NEBU
2.5000 mg | INHALATION_SOLUTION | RESPIRATORY_TRACT | Status: DC | PRN
  Administered 2017-11-30: 2.5 mg via RESPIRATORY_TRACT
  Filled 2017-11-30: qty 3

## 2017-11-30 MED ORDER — SODIUM CHLORIDE 0.9 % IV SOLN: 250.0000 mL | INTRAVENOUS | Status: DC | PRN

## 2017-11-30 MED ORDER — SENNOSIDES-DOCUSATE SODIUM 8.6-50 MG PO TABS: 1.0000 | ORAL_TABLET | Freq: Every evening | ORAL | Status: DC | PRN

## 2017-11-30 MED ORDER — DEXTROSE 5 % IV SOLN: 500.0000 mg | INTRAVENOUS | Status: DC

## 2017-11-30 NOTE — Plan of Care (Signed)
  Nutrition: Adequate nutrition will be maintained 11/30/2017 1213 - Progressing by Darrow BussingArcilla, Navon Kotowski M, RN   Coping: Level of anxiety will decrease 11/30/2017 1213 - Progressing by Darrow BussingArcilla, Avanell Banwart M, RN   Elimination: Will not experience complications related to bowel motility 11/30/2017 1213 - Progressing by Darrow BussingArcilla, Royal Beirne M, RN   Pain Managment: General experience of comfort will improve 11/30/2017 1213 - Progressing by Darrow BussingArcilla, Gabino Hagin M, RN   Safety: Ability to remain free from injury will improve 11/30/2017 1213 - Progressing by Darrow BussingArcilla, Silveria Botz M, RN

## 2017-11-30 NOTE — H&P (Signed)
History and Physical    Olivia Charles JYN:[id number] DOB: 1990/11/15 DOA: 11/29/2017  PCP: Default, Provider, MD   Patient coming from: Home  Chief Complaint: Chest pain, cough   HPI: Olivia Charles is a 27 y.o. female with medical history significant for severe thoracolumbar scoliosis status post surgery, and esophageal reconstruction as an infant, now presenting to the emergency department for evaluation of cough and chest pain.  Patient reports that she had been in her usual state of health until approximately 1 week ago when she developed a cough, occasionally productive of thick sputum, as well as pain in the central and lower chest with radiation to the back.  There is no alleviating or exacerbating factors identified for her chest pain.  She denies vomiting or dysphasia.  She was seen at an urgent care, esophageal etiology was suspected, and she was started on omeprazole.  Despite this medication, her symptoms persisted, prompting her to seek evaluation today in the ED.  ED Course: Upon arrival to the ED, patient is found to be afebrile, saturating adequately on room air, mildly tachypneic, mildly tachycardic, and with diastolic blood pressure of 100.  EKG features a normal sinus rhythm.  Chemistry panel is unremarkable.  CBC is notable for leukocytosis to 14,900.  Troponin is undetectable.  CTA chest is negative for PE, but notable for left lower lobe pneumonia and abnormally thickened esophagus with distal fluid collection, possibly representing a diverticulum.  Blood cultures were collected, 1 L of normal saline was administered patient was treated with empiric Rocephin and azithromycin.  She remains hemodynamically stable, but has persistent tachypnea while at rest and will be observed on the medical-surgical unit for ongoing evaluation and management of community-acquired pneumonia, possibly aspiration.  Review of Systems:  All other systems reviewed and apart from HPI, are  negative.  History reviewed. No pertinent past medical history.  Past Surgical History:  Procedure Laterality Date  . BACK SURGERY    . ESOPHAGEAL RECONSTRUCTION       reports that  has never smoked. she has never used smokeless tobacco. She reports that she drinks alcohol. She reports that she does not use drugs.  No Known Allergies  Family History  Problem Relation Age of Onset  . Hypertension Other      Prior to Admission medications   Medication Sig Start Date End Date Taking? Authorizing Provider  ibuprofen (ADVIL,MOTRIN) 200 MG tablet Take 400 mg by mouth every 6 (six) hours as needed for headache (pain).   Yes [provider]  MedroxyPROGESTERone Acetate 150 MG/ML SUSY Inject 150 mg into the muscle every 3 (three) months. Next injection due 12/03/17 09/16/17  Yes [provider]  omeprazole (PRILOSEC) 20 MG capsule Take 20 mg by mouth daily. 11/21/17  Yes [provider]  Tetrahydrozoline HCl (VISINE OP) Place 1 drop into both eyes daily as needed (irritation).   Yes [provider]  valACYclovir (VALTREX) 500 MG tablet Take 500 mg by mouth daily.   Yes [provider]    Physical Exam: Vitals:   11/29/17 2130 11/29/17 2200 11/29/17 2230 11/29/17 2330  BP: (!) 126/92 (!) 156/108 (!) 130/98 (!) 135/98  Pulse: 96 94 93 82  Resp: (!) 24 (!) 23 18 17   Temp:      TempSrc:      SpO2: 100% 100% 99% 99%  Weight:      Height:          Constitutional: NAD, calm  Eyes: PERTLA, lids and conjunctivae  normal ENMT: Mucous membranes are moist. Posterior pharynx clear of any exudate or lesions.   Neck: normal, supple, no masses, no thyromegaly Respiratory: Tachypnea, rhonchi on the left. No accessory muscle use.  Cardiovascular: Rate ~110 and regular. No extremity edema. No significant JVD. Abdomen: No distension, no tenderness, no masses palpated. Bowel sounds normal.  Musculoskeletal: no clubbing / cyanosis. Severe scoliosis.   Skin: no significant rashes, lesions, ulcers. Warm, dry, well-perfused. Neurologic: CN 2-12 grossly intact. Sensation intact. Strength 5/5 in all 4 limbs.  Psychiatric: Alert and oriented x 3. Pleasant and cooperative.     Labs on Admission: I have personally reviewed following labs and imaging studies  CBC: Recent Labs  Lab 11/29/17 1720  WBC 14.9*  HGB 12.0  HCT 35.6*  MCV 86.8  PLT 253   Basic Metabolic Panel: Recent Labs  Lab 11/29/17 1720  NA 138  K 3.7  CL 104  CO2 22  GLUCOSE 90  BUN 15  CREATININE 0.69  CALCIUM 8.7*   GFR: Estimated Creatinine Clearance: 93.5 mL/min (by C-G formula based on SCr of 0.69 mg/dL). Liver Function Tests: No results for input(s): AST, ALT, ALKPHOS, BILITOT, PROT, ALBUMIN in the last 168 hours. No results for input(s): LIPASE, AMYLASE in the last 168 hours. No results for input(s): AMMONIA in the last 168 hours. Coagulation Profile: No results for input(s): INR, PROTIME in the last 168 hours. Cardiac Enzymes: No results for input(s): CKTOTAL, CKMB, CKMBINDEX, TROPONINI in the last 168 hours. BNP (last 3 results) No results for input(s): PROBNP in the last 8760 hours. HbA1C: No results for input(s): HGBA1C in the last 72 hours. CBG: No results for input(s): GLUCAP in the last 168 hours. Lipid Profile: No results for input(s): CHOL, HDL, LDLCALC, TRIG, CHOLHDL, LDLDIRECT in the last 72 hours. Thyroid Function Tests: No results for input(s): TSH, T4TOTAL, FREET4, T3FREE, THYROIDAB in the last 72 hours. Anemia Panel: No results for input(s): VITAMINB12, FOLATE, FERRITIN, TIBC, IRON, RETICCTPCT in the last 72 hours. Urine analysis:    Component Value Date/Time   COLORURINE YELLOW 01/29/2012 1922   APPEARANCEUR CLEAR 01/29/2012 1922   LABSPEC 1.031 (H) 01/29/2012 1922   PHURINE 6.5 01/29/2012 1922   GLUCOSEU NEGATIVE 01/29/2012 1922   HGBUR NEGATIVE 01/29/2012 1922   BILIRUBINUR NEGATIVE 01/29/2012 1922   KETONESUR 40 (A)  01/29/2012 1922   PROTEINUR 30 (A) 01/29/2012 1922   UROBILINOGEN 1.0 01/29/2012 1922   NITRITE NEGATIVE 01/29/2012 1922   LEUKOCYTESUR NEGATIVE 01/29/2012 1922   Sepsis Labs: @LABRCNTIP (procalcitonin:4,lacticidven:4) )No results found for this or any previous visit (from the past 240 hour(s)).   Radiological Exams on Admission: Dg Chest 2 View  Result Date: 11/29/2017 CLINICAL DATA:  Chest pain with onset 1 week ago. EXAM: CHEST  2 VIEW COMPARISON:  01/29/2012 FINDINGS: Cardiomediastinal silhouette is normal. Mediastinal contours appear intact. There is no evidence of pleural effusion or pneumothorax. Airspace consolidation in the inferior aspect of the left upper lobe/left middle lobe. Gas soft tissue levels seen on the lateral view. Osseous structures are without acute abnormality. Prior posterior spinal fusion. Soft tissues are grossly normal. IMPRESSION: Airspace consolidation in the mid left thorax with gas soft tissue levels seen on the lateral view. This may represent cavitating pneumonia or other space-occupying lesion. Further evaluation with chest CT with contrast may be considered. Electronically Signed   By: Ted Mcalpine M.D.   On: 11/29/2017 17:38   Ct Angio Chest Pe W And/or Wo Contrast  Result Date: 11/29/2017 CLINICAL DATA:  Chest pain, shortness of Breath. Remote history of esophageal reconstruction. EXAM: CT ANGIOGRAPHY CHEST WITH CONTRAST TECHNIQUE: Multidetector CT imaging of the chest was performed using the standard protocol during bolus administration of intravenous contrast. Multiplanar CT image reconstructions and MIPs were obtained to evaluate the vascular anatomy. CONTRAST:  ISOVUE-370 IOPAMIDOL (ISOVUE-370) INJECTION 76% COMPARISON:  None. FINDINGS: Cardiovascular: Heart is normal size. Aorta is normal caliber. No filling defects in the pulmonary arteries to suggest pulmonary emboli. Mediastinum/Nodes: No adenopathy. The esophagus abnormal appearing. The  fluid-filled. Possible diverticulum off the distal esophagus, measuring 1.9 cm on image 87. Esophagus is difficult to follow in the upper thoracic region. Lungs/Pleura: Gas collection noted in the anterior left hemithorax. On the coronal views, this appears to represent colon passing through a very small anterior diaphragmatic defect seen best on sagittal image 83. The air-fluid level seen on chest x-ray were in this herniated colon segment. When comparing to prior chest x-ray 01/29/2012, there appear to be colon within the chest on that study this was difficult to visualize. This presumably represents the esophageal reconstruction reported by the patient. Areas of nodular ground-glass consolidation throughout the left lower lobe compatible with pneumonia or aspiration pneumonia. Right lung is clear. No effusions. Upper Abdomen: Although difficult to see, anterior diaphragmatic noted noted on the left at approximately image 107 of the axial images. No acute findings in the upper abdomen. Musculoskeletal: There is gas tracking from the colon within the upper left hemithorax which tracks superiorly anterior to the left innominate vein and tracks into the left side of the neck. This presumably is part of the esophageal reconstruction with the colon which likely ties into the left side of the cervical esophagus. Severe thoracolumbar scoliosis. Posterior spinal rods noted in the thoracic and visualized lumbar spine. Review of the MIP images confirms the above findings. IMPRESSION: The air-fluid level seen on today's chest x-ray appear to be within colon in the left anterior hemithorax. Gas and stool filled loop of colon extend from the anterior left hemidiaphragm superiorly to the left apex. In retrospect, this herniated colon was likely present in the left chest on remote chest x-ray from 01/29/2012. In addition, gas extends from the anterior left hemithorax and the herniated colon superiorly in the left side of the  neck. The these findings presumably represent the esophageal reconstruction with a colon segment and layering food material dependently in the dilated colon. Abnormal appearance of the esophagus which appears thick walled in some areas and is fluid-filled and dilated throughout its course in the chest. Distally, there is fluid adjacent to the distal esophagus measuring 1.9 cm which may reflect a distal esophageal diverticulum. Consolidation noted in the left lower lobe compatible with pneumonia. Cannot exclude aspiration pneumonia. These results were called by telephone at the time of interpretation on 11/29/2017 at 11:12 pm to Dr. Lynden Oxford , who verbally acknowledged these results. Electronically Signed   By: Charlett Nose M.D.   On: 11/29/2017 23:12    EKG: Independently reviewed. Normal sinus rhythm.   Assessment/Plan  1. CAP  - Presents with 1 week of cough and chest pain  - CTA chest negative for PE, but notable for LLL pneumonia, and findings related to remote esophageal reconstruction  - There is fluid and food noted in esophagus on CTA and aspiration is considered though she denies dysphagia or vomiting  - Blood cultures were collected in ED and she was treated with 1 liter of NS, Rocephin, and azithromycin  - Check  sputum culture and strep pneumo and legionella antigens, check procalcitonin  - Continue Rocephin and azithromycin, add Flagyl for possible aspiration, follow cultures and clinical course    2. Chest pain - Suspected secondary to #1  - Troponin is undetectable and EKG unremarkable  - She denies dysphagia or vomiting  - Esophageal etiology is also considered; advised against Advil use, started on Pepcid  3. Elevated BP  - BP is elevated in ED  - Patient denies hx of HTN, but has not seen a physician in years  - Treat pain, use hydralazine IVP's prn     DVT prophylaxis: Lovenox Code Status: Full  Family Communication: Discussed with patient Disposition Plan:  Observe on med-surg Consults called: None Admission status: Observation    Briscoe Deutscherimothy S Aurelia Gras, MD [facility] Pager [phone]  If 7PM-7AM, please contact night-coverage [email or web address] Password [email or web address]  11/30/2017, 12:11 AM

## 2017-11-30 NOTE — Progress Notes (Signed)
[facility] update note  Patient is doing well.  Still tachycardic.  States shortness of breath is improved.  Reviewed H&P by Dr. Antionette Charpyd.  We will continue current plan of care.  Order incentive spirometer and flutter valve.  Haydee SalterPhillip M Hazim Treadway, MD

## 2017-11-30 NOTE — ED Notes (Signed)
Sent add on lab to main lab for procalcitonin blood test.

## 2017-12-01 DIAGNOSIS — J181 Lobar pneumonia, unspecified organism: Secondary | ICD-10-CM | POA: Diagnosis not present

## 2017-12-01 DIAGNOSIS — R030 Elevated blood-pressure reading, without diagnosis of hypertension: Secondary | ICD-10-CM | POA: Diagnosis not present

## 2017-12-01 LAB — BASIC METABOLIC PANEL
ANION GAP: 11 (ref 5–15)
BUN: 9 mg/dL (ref 6–20)
CHLORIDE: 98 mmol/L — AB (ref 101–111)
CO2: 23 mmol/L (ref 22–32)
Calcium: 8.9 mg/dL (ref 8.9–10.3)
Creatinine, Ser: 0.59 mg/dL (ref 0.44–1.00)
GFR calc non Af Amer: >60 mL/min (ref >60)
Glucose, Bld: 112 mg/dL — ABNORMAL HIGH (ref 65–99)
POTASSIUM: 4 mmol/L (ref 3.5–5.1)
Sodium: 132 mmol/L — ABNORMAL LOW (ref 135–145)

## 2017-12-01 LAB — CBC WITH DIFFERENTIAL/PLATELET
BASOS PCT: 0 %
Basophils Absolute: 0 10*3/uL (ref 0.0–0.1)
Eosinophils Absolute: 0 10*3/uL (ref 0.0–0.7)
Eosinophils Relative: 0 %
HEMATOCRIT: 35.9 % — AB (ref 36.0–46.0)
HEMOGLOBIN: 12.3 g/dL (ref 12.0–15.0)
LYMPHS ABS: 2 10*3/uL (ref 0.7–4.0)
LYMPHS PCT: 16 %
MCH: 29.9 pg (ref 26.0–34.0)
MCHC: 34.3 g/dL (ref 30.0–36.0)
MCV: 87.1 fL (ref 78.0–100.0)
MONOS PCT: 4 %
Monocytes Absolute: 0.5 10*3/uL (ref 0.1–1.0)
NEUTROS ABS: 10.2 10*3/uL — AB (ref 1.7–7.7)
NEUTROS PCT: 80 %
Platelets: 265 10*3/uL (ref 150–400)
RBC: 4.12 MIL/uL (ref 3.87–5.11)
RDW: 13.6 % (ref 11.5–15.5)
WBC: 12.7 10*3/uL — ABNORMAL HIGH (ref 4.0–10.5)

## 2017-12-01 LAB — LEGIONELLA PNEUMOPHILA SEROGP 1 UR AG: L. pneumophila Serogp 1 Ur Ag: NEGATIVE

## 2017-12-01 LAB — PROCALCITONIN: Procalcitonin: <0.1 ng/mL

## 2017-12-01 MED ORDER — SODIUM CHLORIDE 0.9 % IV BOLUS (SEPSIS)
500.0000 mL | Freq: Once | INTRAVENOUS | Status: AC
Start: 2017-12-01 — End: 2017-12-01
  Administered 2017-12-01: 500 mL via INTRAVENOUS

## 2017-12-01 MED ORDER — FAMOTIDINE 20 MG PO TABS: 20.0000 mg | ORAL_TABLET | Freq: Two times a day (BID) | ORAL | 0 refills | Status: DC

## 2017-12-01 MED ORDER — METRONIDAZOLE 500 MG PO TABS: 500.0000 mg | ORAL_TABLET | Freq: Three times a day (TID) | ORAL | 0 refills | Status: DC

## 2017-12-01 MED ORDER — AZITHROMYCIN 500 MG PO TABS: 500.0000 mg | ORAL_TABLET | Freq: Every day | ORAL | 0 refills | Status: DC

## 2017-12-01 MED ORDER — ONDANSETRON HCL 4 MG PO TABS: 4.0000 mg | ORAL_TABLET | Freq: Four times a day (QID) | ORAL | 0 refills | Status: AC | PRN

## 2017-12-01 MED ORDER — GUAIFENESIN ER 600 MG PO TB12: 600.0000 mg | ORAL_TABLET | Freq: Two times a day (BID) | ORAL | 0 refills | Status: AC

## 2017-12-01 NOTE — Plan of Care (Signed)
  Nutrition: Adequate nutrition will be maintained 12/01/2017 0957 - Progressing by Darrow BussingArcilla, Mitsuye Schrodt M, RN   Elimination: Will not experience complications related to bowel motility 12/01/2017 0957 - Progressing by Darrow BussingArcilla, Ryatt Corsino M, RN   Pain Managment: General experience of comfort will improve 12/01/2017 0957 - Progressing by Darrow BussingArcilla, Aaran Enberg M, RN   Safety: Ability to remain free from injury will improve 12/01/2017 0957 - Progressing by Darrow BussingArcilla, Tracie Dore M, RN

## 2017-12-01 NOTE — Progress Notes (Signed)
Removed IV, provided discharge education/instructions, all questions and concerns addressed, Pt not in distress, denied pain and shortness of breath, dizziness, discharged home with belongings.

## 2017-12-01 NOTE — Progress Notes (Signed)
Pt ambulated in the unit, no shortness of breath, denied chest pain, nausea and dizziness. Pt tolerated well. Pt continued to use incentive spirometer and educated on using flutter valve, stated and demonstrated understanding.

## 2017-12-01 NOTE — Discharge Summary (Signed)
Physician Discharge Summary  Zenith Kercheval ZOX:[id number] DOB: 1991-09-22 DOA: 11/29/2017  PCP: Default, Provider, MD  Admit date: 11/29/2017 Discharge date: 12/01/2017  Time spent: 35 minutes  Recommendations for Outpatient Follow-up:  1. Follow-up with PCP in next 7 days    Discharge Diagnoses:  Principal Problem:   CAP (community acquired pneumonia) Active Problems:   Chest pain   Elevated blood pressure reading   Discharge Condition: Stable  Diet recommendation: Regular  Filed Weights   11/29/17 1702  Weight: 88 kg (194 lb)    History of present illness:  Olivia Charles is a 27 y.o. 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 She reports clinically improving on antibiotic course in the hospital and did well for the first couple of days, subsequently on Wednesday onward started having left back pleuritic chest pain and cough which was minimally productive of yellow/brown sputum. In addition she also reported subjective sensations of hot and cold never checked her temperature, though she felt feverish. -She is still heating pad and NSAIDs with mild to moderate benefit however due to worsening of her back/chest pain presented to the emergency room again today where she was noted to be tachycardic, with increasing leukocytosis, mild lactic acidosis. CT angiogram of the chest was negative for pulmonary embolism showed small left pleural effusion and left lower lobe atelectasis.  Hospital Course:   Pt improved on IV abx and nebulizer. CTA was negative. Blood cult ng. Strep ur neg. Pt felt better. Pt ambulated in the hospital  without issues. D/c home with d.c precautions.  Procedures:  n/a  Consultations:  n/a  Discharge Exam: Vitals:   11/30/17 1953 12/01/17 0457  BP: 116/76 110/80  Pulse: (!) 129 91  Resp:    Temp: 98.6 F (37 C) 98 F (36.7 C)  SpO2: 100% 99%    General: NAD, NCAT Cardiovascular: RRR, no MRG Respiratory: CTAB, nl wob  Discharge Instructions    Allergies as of 12/01/2017   No Known Allergies     Medication List    TAKE these medications   omeprazole 20 MG capsule Commonly known as:  PRILOSEC Take 20 mg by mouth daily.   ondansetron 4 MG tablet Commonly known as:  ZOFRAN Take 1 tablet (4 mg total) by mouth every 6 (six) hours as needed for nausea.   valACYclovir 500 MG tablet Commonly known as:  VALTREX Take 500 mg by mouth daily.   VISINE OP Place 1 drop into both eyes daily as needed (irritation).     ASK your doctor about these medications   guaiFENesin 600 MG 12 hr tablet Commonly known as:  MUCINEX Take 1 tablet (600 mg total) by mouth 2 (two) times daily for 7 days. Ask about: Should I take this medication?      No Known Allergies    The results of significant diagnostics from this hospitalization (including imaging, microbiology, ancillary and laboratory) are listed below for reference.    Significant Diagnostic Studies: Dg Chest 2 View  Result Date: 11/29/2017 CLINICAL DATA:  Chest pain with onset 1 week ago. EXAM: CHEST  2 VIEW COMPARISON:  01/29/2012 FINDINGS: Cardiomediastinal silhouette is normal. Mediastinal contours appear intact. There is no evidence of pleural effusion or pneumothorax. Airspace consolidation  in the inferior aspect of the left upper lobe/left middle lobe. Gas soft tissue levels seen on the lateral view. Osseous structures are without acute abnormality. Prior posterior spinal fusion. Soft tissues are grossly normal. IMPRESSION: Airspace consolidation in the mid left thorax with gas soft tissue levels seen on the lateral  view. This may represent cavitating pneumonia or other space-occupying lesion. Further evaluation with chest CT with contrast may be considered. Electronically Signed   By: Ted Mcalpine M.D.   On: 11/29/2017 17:38   Ct Angio Chest Pe W And/or Wo Contrast  Result Date: 11/29/2017 CLINICAL DATA:  Chest pain, shortness of Breath. Remote history of esophageal reconstruction. EXAM: CT ANGIOGRAPHY CHEST WITH CONTRAST TECHNIQUE: Multidetector CT imaging of the chest was performed using the standard protocol during bolus administration of intravenous contrast. Multiplanar CT image reconstructions and MIPs were obtained to evaluate the vascular anatomy. CONTRAST:  ISOVUE-370 IOPAMIDOL (ISOVUE-370) INJECTION 76% COMPARISON:  None. FINDINGS: Cardiovascular: Heart is normal size. Aorta is normal caliber. No filling defects in the pulmonary arteries to suggest pulmonary emboli. Mediastinum/Nodes: No adenopathy. The esophagus abnormal appearing. The fluid-filled. Possible diverticulum off the distal esophagus, measuring 1.9 cm on image 87. Esophagus is difficult to follow in the upper thoracic region. Lungs/Pleura: Gas collection noted in the anterior left hemithorax. On the coronal views, this appears to represent colon passing through a very small anterior diaphragmatic defect seen best on sagittal image 83. The air-fluid level seen on chest x-ray were in this herniated colon segment. When comparing to prior chest x-ray 01/29/2012, there appear to be colon within the chest on that study this was difficult to visualize. This presumably represents the esophageal reconstruction reported by the patient. Areas of nodular ground-glass consolidation throughout the left lower lobe compatible with pneumonia or aspiration pneumonia. Right lung is clear. No effusions. Upper Abdomen: Although difficult to see, anterior diaphragmatic noted noted on the left at approximately image 107 of the axial images. No acute findings in  the upper abdomen. Musculoskeletal: There is gas tracking from the colon within the upper left hemithorax which tracks superiorly anterior to the left innominate vein and tracks into the left side of the neck. This presumably is part of the esophageal reconstruction with the colon which likely ties into the left side of the cervical esophagus. Severe thoracolumbar scoliosis. Posterior spinal rods noted in the thoracic and visualized lumbar spine. Review of the MIP images confirms the above findings. IMPRESSION: The air-fluid level seen on today's chest x-ray appear to be within colon in the left anterior hemithorax. Gas and stool filled loop of colon extend from the anterior left hemidiaphragm superiorly to the left apex. In retrospect, this herniated colon was likely present in the left chest on remote chest x-ray from 01/29/2012. In addition, gas extends from the anterior left hemithorax and the herniated colon superiorly in the left side of the neck. The these findings presumably represent the esophageal reconstruction with a colon segment and layering food material dependently in the dilated colon. Abnormal appearance of the esophagus which appears thick walled in some areas and is fluid-filled and dilated throughout its course in the chest. Distally, there is fluid adjacent to the distal esophagus measuring 1.9 cm which may reflect a distal esophageal diverticulum. Consolidation noted in the left lower lobe compatible with pneumonia. Cannot exclude aspiration pneumonia. These results were called by telephone at the time of interpretation on 11/29/2017 at 11:12 pm to Dr. Lynden Oxford , who verbally acknowledged these results. Electronically  Signed   By: Charlett NoseKevin  Dover M.D.   On: 11/29/2017 23:12    Microbiology: Recent Results (from the past 240 hour(s))  Blood culture (routine x 2)     Status: None (Preliminary result)   Collection Time: 11/29/17 10:00 PM  Result Value Ref Range Status   Specimen  Description BLOOD LEFT ANTECUBITAL  Final   Special Requests   Final    BOTTLES DRAWN AEROBIC AND ANAEROBIC Blood Culture adequate volume   Culture   Final    NO GROWTH < 12 HOURS Performed at [facility], [address] [place], [place] [address]    Report Status PENDING  Incomplete  Blood culture (routine x 2)     Status: None (Preliminary result)   Collection Time: 11/29/17 10:10 PM  Result Value Ref Range Status   Specimen Description BLOOD RIGHT ANTECUBITAL  Final   Special Requests   Final    BOTTLES DRAWN AEROBIC AND ANAEROBIC Blood Culture adequate volume   Culture   Final    NO GROWTH < 12 HOURS Performed at [facility], [address] [place], [place] [address]    Report Status PENDING  Incomplete     Labs: Basic Metabolic Panel: Recent Labs  Lab 11/29/17 1720 11/30/17 0442 12/01/17 0558  NA 138 137 132*  K 3.7 3.7 4.0  CL 104 104 98*  CO2 22 22 23   GLUCOSE 90 84 112*  BUN 15 11 9   CREATININE 0.69 0.60 0.59  CALCIUM 8.7* 8.5* 8.9   Liver Function Tests: Recent Labs  Lab 11/30/17 0442  AST 16  ALT 13*  ALKPHOS 49  BILITOT 0.5  PROT 5.8*  ALBUMIN 3.3*   No results for input(s): LIPASE, AMYLASE in the last 168 hours. No results for input(s): AMMONIA in the last 168 hours. CBC: Recent Labs  Lab 11/29/17 1720 11/30/17 0442 12/01/17 0558  WBC 14.9* 11.9* 12.7*  NEUTROABS  --  8.2* 10.2*  HGB 12.0 11.5* 12.3  HCT 35.6* 33.7* 35.9*  MCV 86.8 87.5 87.1  PLT 253 256 265   Cardiac Enzymes: No results for input(s): CKTOTAL, CKMB, CKMBINDEX, TROPONINI in the last 168 hours. BNP: BNP (last 3 results) No results for input(s): BNP in the last 8760 hours.  ProBNP (last 3 results) No results for input(s): PROBNP in the last 8760 hours.  CBG: No results for input(s): GLUCAP in the last 168 hours.     Signed:  Haydee SalterPhillip M Jahaad Penado MD  FACP  [facility] 12/01/2017, 11:49 AM

## 2017-12-01 NOTE — Discharge Instructions (Signed)

## 2017-12-03 LAB — HIV ANTIBODY (ROUTINE TESTING W REFLEX): HIV Screen 4th Generation wRfx: NONREACTIVE

## 2017-12-04 LAB — CULTURE, BLOOD (ROUTINE X 2)
CULTURE: NO GROWTH
Culture: NO GROWTH
SPECIAL REQUESTS: ADEQUATE
Special Requests: ADEQUATE

## 2017-12-05 ENCOUNTER — Inpatient Hospital Stay (HOSPITAL_COMMUNITY)
Admission: EM | Admit: 2017-12-05 | Discharge: 2017-12-07 | DRG: 177 | Disposition: A | Discharge status: Other Acute Inpatient Hospital | Payer: 59 | Attending: Internal Medicine | Admitting: Internal Medicine

## 2017-12-05 ENCOUNTER — Emergency Department (HOSPITAL_COMMUNITY): Payer: 59

## 2017-12-05 ENCOUNTER — Encounter (HOSPITAL_COMMUNITY): Payer: Self-pay | Admitting: Emergency Medicine

## 2017-12-05 ENCOUNTER — Other Ambulatory Visit: Payer: Self-pay

## 2017-12-05 DIAGNOSIS — E872 Acidosis: Secondary | ICD-10-CM | POA: Diagnosis present

## 2017-12-05 DIAGNOSIS — J189 Pneumonia, unspecified organism: Secondary | ICD-10-CM | POA: Diagnosis not present

## 2017-12-05 DIAGNOSIS — Z79899 Other long term (current) drug therapy: Secondary | ICD-10-CM | POA: Diagnosis not present

## 2017-12-05 DIAGNOSIS — R079 Chest pain, unspecified: Secondary | ICD-10-CM | POA: Diagnosis not present

## 2017-12-05 DIAGNOSIS — J918 Pleural effusion in other conditions classified elsewhere: Secondary | ICD-10-CM | POA: Diagnosis present

## 2017-12-05 DIAGNOSIS — J181 Lobar pneumonia, unspecified organism: Secondary | ICD-10-CM | POA: Diagnosis present

## 2017-12-05 DIAGNOSIS — J869 Pyothorax without fistula: Secondary | ICD-10-CM | POA: Diagnosis present

## 2017-12-05 DIAGNOSIS — J90 Pleural effusion, not elsewhere classified: Secondary | ICD-10-CM | POA: Diagnosis not present

## 2017-12-05 DIAGNOSIS — Z9889 Other specified postprocedural states: Secondary | ICD-10-CM

## 2017-12-05 DIAGNOSIS — R0600 Dyspnea, unspecified: Secondary | ICD-10-CM | POA: Diagnosis not present

## 2017-12-05 DIAGNOSIS — J9811 Atelectasis: Secondary | ICD-10-CM | POA: Diagnosis present

## 2017-12-05 DIAGNOSIS — Z23 Encounter for immunization: Secondary | ICD-10-CM | POA: Diagnosis not present

## 2017-12-05 DIAGNOSIS — Z981 Arthrodesis status: Secondary | ICD-10-CM | POA: Diagnosis not present

## 2017-12-05 LAB — BASIC METABOLIC PANEL
Anion gap: 16 — ABNORMAL HIGH (ref 5–15)
BUN: 11 mg/dL (ref 6–20)
CALCIUM: 9 mg/dL (ref 8.9–10.3)
CO2: 21 mmol/L — AB (ref 22–32)
Chloride: 97 mmol/L — ABNORMAL LOW (ref 101–111)
Creatinine, Ser: 0.83 mg/dL (ref 0.44–1.00)
GFR calc Af Amer: >60 mL/min (ref >60)
GFR calc non Af Amer: >60 mL/min (ref >60)
GLUCOSE: 141 mg/dL — AB (ref 65–99)
Potassium: 3.6 mmol/L (ref 3.5–5.1)
Sodium: 134 mmol/L — ABNORMAL LOW (ref 135–145)

## 2017-12-05 LAB — HEPATIC FUNCTION PANEL
ALBUMIN: 4.2 g/dL (ref 3.5–5.0)
ALT: 31 U/L (ref 14–54)
AST: 30 U/L (ref 15–41)
Alkaline Phosphatase: 48 U/L (ref 38–126)
Bilirubin, Direct: 0.1 mg/dL (ref 0.1–0.5)
Indirect Bilirubin: 0.5 mg/dL (ref 0.3–0.9)
Total Bilirubin: 0.6 mg/dL (ref 0.3–1.2)
Total Protein: 6.9 g/dL (ref 6.5–8.1)

## 2017-12-05 LAB — CBC
HCT: 30.1 % — ABNORMAL LOW (ref 36.0–46.0)
HEMATOCRIT: 35.4 % — AB (ref 36.0–46.0)
HEMOGLOBIN: 10.2 g/dL — AB (ref 12.0–15.0)
Hemoglobin: 11.9 g/dL — ABNORMAL LOW (ref 12.0–15.0)
MCH: 29.2 pg (ref 26.0–34.0)
MCH: 29.7 pg (ref 26.0–34.0)
MCHC: 33.6 g/dL (ref 30.0–36.0)
MCHC: 33.9 g/dL (ref 30.0–36.0)
MCV: 87 fL (ref 78.0–100.0)
MCV: 87.8 fL (ref 78.0–100.0)
Platelets: 258 10*3/uL (ref 150–400)
Platelets: 269 10*3/uL (ref 150–400)
RBC: 4.07 MIL/uL (ref 3.87–5.11)
RBC: 3.43 MIL/uL — AB (ref 3.87–5.11)
RDW: 13.6 % (ref 11.5–15.5)
RDW: 14.2 % (ref 11.5–15.5)
WBC: 19.1 10*3/uL — ABNORMAL HIGH (ref 4.0–10.5)
WBC: 19.7 10*3/uL — ABNORMAL HIGH (ref 4.0–10.5)

## 2017-12-05 LAB — I-STAT CG4 LACTIC ACID, ED
LACTIC ACID, VENOUS: 1.75 mmol/L (ref 0.5–1.9)
Lactic Acid, Venous: 2.91 mmol/L (ref 0.5–1.9)

## 2017-12-05 LAB — TSH: TSH: 1.151 u[IU]/mL (ref 0.350–4.500)

## 2017-12-05 LAB — INFLUENZA PANEL BY PCR (TYPE A & B)
INFLAPCR: NEGATIVE
INFLBPCR: NEGATIVE

## 2017-12-05 LAB — CREATININE, SERUM
Creatinine, Ser: 0.66 mg/dL (ref 0.44–1.00)
GFR calc Af Amer: >60 mL/min (ref >60)
GFR calc non Af Amer: >60 mL/min (ref >60)

## 2017-12-05 LAB — I-STAT TROPONIN, ED: Troponin i, poc: 0 ng/mL (ref 0.00–0.08)

## 2017-12-05 LAB — I-STAT BETA HCG BLOOD, ED (MC, WL, AP ONLY): I-stat hCG, quantitative: <5 m[IU]/mL (ref <5)

## 2017-12-05 LAB — LIPASE, BLOOD: Lipase: 37 U/L (ref 11–51)

## 2017-12-05 MED ORDER — ONDANSETRON HCL 4 MG/2ML IJ SOLN: 4.0000 mg | Freq: Four times a day (QID) | INTRAMUSCULAR | Status: DC | PRN

## 2017-12-05 MED ORDER — NAPROXEN 250 MG PO TABS
250.0000 mg | ORAL_TABLET | Freq: Two times a day (BID) | ORAL | Status: DC
  Administered 2017-12-06: 250 mg via ORAL
  Filled 2017-12-05 (×2): qty 1

## 2017-12-05 MED ORDER — ENOXAPARIN SODIUM 40 MG/0.4ML ~~LOC~~ SOLN: 40.0000 mg | SUBCUTANEOUS | Status: DC

## 2017-12-05 MED ORDER — VANCOMYCIN HCL IN DEXTROSE 1-5 GM/200ML-% IV SOLN: 1000.0000 mg | Freq: Once | INTRAVENOUS | Status: DC

## 2017-12-05 MED ORDER — SODIUM CHLORIDE 0.9 % IV BOLUS (SEPSIS)
1000.0000 mL | Freq: Once | INTRAVENOUS | Status: AC
  Administered 2017-12-05: 1000 mL via INTRAVENOUS

## 2017-12-05 MED ORDER — LORAZEPAM 2 MG/ML IJ SOLN
0.5000 mg | Freq: Once | INTRAMUSCULAR | Status: AC
  Administered 2017-12-05: 0.5 mg via INTRAVENOUS
  Filled 2017-12-05: qty 1

## 2017-12-05 MED ORDER — ONDANSETRON HCL 4 MG/2ML IJ SOLN
4.0000 mg | Freq: Once | INTRAMUSCULAR | Status: AC
Start: 2017-12-05 — End: 2017-12-05
  Administered 2017-12-05: 4 mg via INTRAVENOUS
  Filled 2017-12-05: qty 2

## 2017-12-05 MED ORDER — ONDANSETRON HCL 4 MG PO TABS: 4.0000 mg | ORAL_TABLET | Freq: Four times a day (QID) | ORAL | Status: DC | PRN

## 2017-12-05 MED ORDER — IOPAMIDOL (ISOVUE-370) INJECTION 76%
INTRAVENOUS | Status: AC
Start: 2017-12-05 — End: 2017-12-05
  Administered 2017-12-05: 100 mL
  Filled 2017-12-05: qty 100

## 2017-12-05 MED ORDER — IOPAMIDOL (ISOVUE-300) INJECTION 61%
INTRAVENOUS | Status: AC
  Administered 2017-12-05: 100 mL
  Filled 2017-12-05: qty 30

## 2017-12-05 MED ORDER — ENOXAPARIN SODIUM 30 MG/0.3ML ~~LOC~~ SOLN
30.0000 mg | SUBCUTANEOUS | Status: DC
  Administered 2017-12-05: 30 mg via SUBCUTANEOUS
  Filled 2017-12-05: qty 0.3

## 2017-12-05 MED ORDER — MORPHINE SULFATE (PF) 4 MG/ML IV SOLN
4.0000 mg | Freq: Once | INTRAVENOUS | Status: AC
  Administered 2017-12-05: 4 mg via INTRAVENOUS
  Filled 2017-12-05: qty 1

## 2017-12-05 MED ORDER — MORPHINE SULFATE (PF) 4 MG/ML IV SOLN
2.0000 mg | INTRAVENOUS | Status: DC | PRN
  Administered 2017-12-05 – 2017-12-06 (×2): 2 mg via INTRAVENOUS
  Filled 2017-12-05 (×2): qty 1

## 2017-12-05 MED ORDER — IOPAMIDOL (ISOVUE-300) INJECTION 61%
INTRAVENOUS | Status: AC
  Filled 2017-12-05: qty 100

## 2017-12-05 MED ORDER — ACETAMINOPHEN 325 MG PO TABS
650.0000 mg | ORAL_TABLET | Freq: Four times a day (QID) | ORAL | Status: DC | PRN
  Administered 2017-12-06: 650 mg via ORAL
  Filled 2017-12-05: qty 2

## 2017-12-05 MED ORDER — VANCOMYCIN HCL IN DEXTROSE 1-5 GM/200ML-% IV SOLN
1000.0000 mg | INTRAVENOUS | Status: DC
  Administered 2017-12-06 – 2017-12-07 (×2): 1000 mg via INTRAVENOUS
  Filled 2017-12-05 (×2): qty 200

## 2017-12-05 MED ORDER — DEXTROSE 5 % IV SOLN
1.0000 g | Freq: Once | INTRAVENOUS | Status: AC
  Administered 2017-12-05: 1 g via INTRAVENOUS
  Filled 2017-12-05: qty 1

## 2017-12-05 MED ORDER — PANTOPRAZOLE SODIUM 40 MG PO TBEC
40.0000 mg | DELAYED_RELEASE_TABLET | Freq: Every day | ORAL | Status: DC
  Administered 2017-12-06 – 2017-12-07 (×2): 40 mg via ORAL
  Filled 2017-12-05 (×2): qty 1

## 2017-12-05 MED ORDER — GI COCKTAIL ~~LOC~~
30.0000 mL | Freq: Once | ORAL | Status: AC
  Administered 2017-12-05: 30 mL via ORAL
  Filled 2017-12-05: qty 30

## 2017-12-05 MED ORDER — GI COCKTAIL ~~LOC~~: 30.0000 mL | Freq: Once | ORAL | Status: DC

## 2017-12-05 MED ORDER — DEXTROSE 5 % IV SOLN
1.0000 g | Freq: Three times a day (TID) | INTRAVENOUS | Status: DC
  Administered 2017-12-05 – 2017-12-07 (×6): 1 g via INTRAVENOUS
  Filled 2017-12-05 (×9): qty 1

## 2017-12-05 MED ORDER — ACETAMINOPHEN 650 MG RE SUPP: 650.0000 mg | Freq: Four times a day (QID) | RECTAL | Status: DC | PRN

## 2017-12-05 MED ORDER — SODIUM CHLORIDE 0.9 % IV SOLN
INTRAVENOUS | Status: DC
  Administered 2017-12-05: via INTRAVENOUS

## 2017-12-05 NOTE — ED Provider Notes (Addendum)
[facility] Note   CSN: [id number] Arrival date & time: 12/05/17  0341     History   Chief Complaint Chief Complaint  Patient presents with  . Back Pain  . Chest Pain    HPI Olivia Charles is a 27 y.o. female.  HPI  Olivia CoddingtonCantranece Grange is a 27 y.o. female presents to emergency department with complaint of chest pain, back pain, shortness of breath.  Patient states her symptoms initially started a few weeks ago.  She was  initially seen at urgent care and diagnosed with gastritis and chest wall pain.  She was given prescription for Pepcid and prednisone.  She states she finished both.  She was seen in the possible pneumonia7 days ago.  She was discharged with Zithromax and Flagyl.  She states she felt better for a few days after discharge, however 2 days ago her symptoms return.  She reports pain in the center of her chest radiating into the left chest and straight into the back.  She reports associated shortness of breath.  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  Also suffering from bad scoliosis.  She however denies any similar symptoms to this in the past.  She denies any recent travel surgeries.  She does not smoke.  She has been taken Tylenol for pain with no relief.  She states pain is worsened with deep breathing and movement.   History reviewed. No pertinent past medical history.  Patient Active Problem List   Diagnosis Date Noted  . CAP (community acquired pneumonia) 11/29/2017  . Chest pain 11/29/2017  . Elevated blood pressure reading 11/29/2017    Past Surgical History:  Procedure Laterality Date  . BACK SURGERY    . ESOPHAGEAL RECONSTRUCTION      OB History    No data available       Home Medications    Prior to Admission medications   Medication Sig Start Date End Date Taking? Authorizing Provider  azithromycin (ZITHROMAX) 500 MG tablet Take 1 tablet (500 mg total) by mouth daily.  12/01/17   Haydee SalterHobbs, Phillip M, MD  famotidine (PEPCID) 20 MG tablet Take 1 tablet (20 mg total) by mouth 2 (two) times daily for 7 days. 12/01/17 12/08/17  Haydee SalterHobbs, Phillip M, MD  guaiFENesin (MUCINEX) 600 MG 12 hr tablet Take 1 tablet (600 mg total) by mouth 2 (two) times daily for 7 days. 12/01/17 12/08/17  Haydee SalterHobbs, Phillip M, MD  ibuprofen (ADVIL,MOTRIN) 200 MG tablet Take 400 mg by mouth every 6 (six) hours as needed for headache (pain).    [provider]  MedroxyPROGESTERone Acetate 150 MG/ML SUSY Inject 150 mg into the muscle every 3 (three) months. Next injection due 12/03/17 09/16/17   [provider]  metroNIDAZOLE (FLAGYL) 500 MG tablet Take 1 tablet (500 mg total) by mouth every 8 (eight) hours. 12/01/17   Haydee SalterHobbs, Phillip M, MD  omeprazole (PRILOSEC) 20 MG capsule Take 20 mg by mouth daily. 11/21/17   [provider]  ondansetron (ZOFRAN) 4 MG tablet Take 1 tablet (4 mg total) by mouth every 6 (six) hours as needed for nausea. 12/01/17   Haydee SalterHobbs, Phillip M, MD  Tetrahydrozoline HCl (VISINE OP) Place 1 drop into both eyes daily as needed (irritation).    [provider]  valACYclovir (VALTREX) 500 MG tablet Take 500 mg by mouth daily.    [provider]    Family History Family History  Problem Relation Age of  Onset  . Hypertension Other     Social History Social History   Tobacco Use  . Smoking status: Never Smoker  . Smokeless tobacco: Never Used  Substance Use Topics  . Alcohol use: Yes    Comment: occ  . Drug use: No     Allergies   Patient has no known allergies.   Review of Systems Review of Systems  Constitutional: Negative for chills and fever.  Respiratory: Positive for cough and shortness of breath. Negative for chest tightness.   Cardiovascular: Positive for chest pain. Negative for palpitations and leg swelling.  Gastrointestinal: Positive for abdominal pain. Negative for diarrhea, nausea and vomiting.  Genitourinary: Negative for  dysuria, flank pain, pelvic pain, vaginal bleeding, vaginal discharge and vaginal pain.  Musculoskeletal: Negative for arthralgias, myalgias, neck pain and neck stiffness.  Skin: Negative for rash.  Neurological: Negative for dizziness, weakness and headaches.  All other systems reviewed and are negative.    Physical Exam Updated Vital Signs BP (!) 140/114 (BP Location: Right Arm)   Pulse (!) 122   Temp 97.8 F (36.6 C) (Oral)   Resp (!) 26   Ht 4\' 8"  (1.422 m)   Wt 42.6 kg (94 lb)   SpO2 100%   BMI 21.07 kg/m   Physical Exam  Constitutional: She appears well-developed and well-nourished. No distress.  HENT:  Head: Normocephalic.  Eyes: Conjunctivae are normal.  Neck: Neck supple.  Cardiovascular: Regular rhythm and normal heart sounds.  tachycardic  Pulmonary/Chest: Effort normal and breath sounds normal. No respiratory distress. She has no wheezes. She has no rales.  Abdominal: Soft. Bowel sounds are normal. She exhibits no distension. There is tenderness. There is no rebound.  Epigastric tenderness  Musculoskeletal: She exhibits no edema.  Neurological: She is alert.  Skin: Skin is warm and dry.  Psychiatric: She has a normal mood and affect. Her behavior is normal.  Nursing note and vitals reviewed.    ED Treatments / Results  Labs (all labs ordered are listed, but only abnormal results are displayed) Labs Reviewed  BASIC METABOLIC PANEL - Abnormal; Notable for the following components:      Result Value   Sodium 134 (*)    Chloride 97 (*)    CO2 21 (*)    Glucose, Bld 141 (*)    Anion gap 16 (*)    All other components within normal limits  CBC - Abnormal; Notable for the following components:   WBC 19.1 (*)    Hemoglobin 11.9 (*)    HCT 35.4 (*)    All other components within normal limits  INFLUENZA PANEL BY PCR (TYPE A & B)  I-STAT TROPONIN, ED  I-STAT BETA HCG BLOOD, ED ([facility], [facility], [facility] ONLY)    EKG ED ECG REPORT   Date: 12/05/2017  Rate: 120    Rhythm: sinus tachycardia  QRS Axis: normal  Intervals: normal  ST/T Wave abnormalities: nonspecific T wave changes  Conduction Disutrbances:none  Narrative Interpretation:   Old EKG Reviewed: unchanged  I have personally reviewed the EKG tracing and agree with the computerized printout as noted.  Radiology Dg Chest 2 View  Result Date: 12/05/2017 CLINICAL DATA:  Acute onset of central chest pain and mild shortness of breath. Upper back pain. EXAM: CHEST  2 VIEW COMPARISON:  CTA of the chest performed 11/29/2017 FINDINGS: As previously noted, the patient is status post esophageal reconstruction using the colon. The colonic loop is largely filled with air, extending over the left hemithorax.  No superimposed focal airspace consolidation is seen. No pleural effusion or pneumothorax is identified. The heart is normal in size. Thoracolumbar spinal fusion hardware is partially imaged. IMPRESSION: No acute cardiopulmonary process seen. Status post esophageal reconstruction using the colon. Electronically Signed   By: Roanna Raider M.D.   On: 12/05/2017 04:45    Procedures Procedures (including critical care time)  Medications Ordered in ED Medications  sodium chloride 0.9 % bolus 1,000 mL (not administered)  sodium chloride 0.9 % bolus 1,000 mL (not administered)  morphine 4 MG/ML injection 4 mg (not administered)  ondansetron (ZOFRAN) injection 4 mg (not administered)     Initial Impression / Assessment and Plan / ED Course  I have reviewed the triage vital signs and the nursing notes.  Pertinent labs & imaging results that were available during my care of the patient were reviewed by me and considered in my medical decision making (see chart for details).     Since with chest pain, shortness of breath, tachycardia, recent visits for the same.  Initially treated with prednisone and Pepcid, as well as Prilosec, was seen 4 days ago here, negative CT angio  other than possible pneumonia and  reconstructed esophagus.  She was treated with antibiotics, Zithromax and Flagyl.  Still taking Flagyl, done with Zithromax.  Here with worsening symptoms.  Patient is tachycardic, heart rate up to 150, tachypnea, oxygen saturation is normal.  She is afebrile.  We will repeat labs, repeat CT to make sure there is no worsening pneumonia, x-rays today.  Will give IV fluids, pain medications, will try GI cocktail.   CT angios negative except for the findings as described above.  Patient continues to be tachycardic despite IV fluids, pain medications, Ativan.  I spoke with Dr. Freida Busman who has seen her as well, he is concerned about some epigastric tenderness on her exam.  We will get CT abdomen and pelvis.   I spoke with cardiothoracic surgery regarding her CT finding of the chest, spoke with their PA, who had surgeon look at the images, and he was not sure if there was anything concerning.  They will call back again once another physician looks at it.  CT abdomen pelvis still pending.  Patient signed out pending imaging results.  Vitals:   12/05/17 1345 12/05/17 1430 12/05/17 1500 12/05/17 1600  BP: 121/89 (!) 114/93 110/80 130/87  Pulse: (!) 130 (!) 125 (!) 126 (!) 120  Resp: 19 (!) 21  (!) 27  Temp:      TempSrc:      SpO2: 98% 98% 99% 100%  Weight:      Height:        Final Clinical Impressions(s) / ED Diagnoses   Final diagnoses:  None    ED Discharge Orders    None       Jaynie Crumble, PA-C 12/05/17 1632    Jaynie Crumble, PA-C 12/13/17 1713

## 2017-12-05 NOTE — ED Triage Notes (Addendum)
Pt initially reported upper back pain lasting a week.  Hx of scoliosis and back surgery (13 years ago), home treatment (head and tylenol ) is not assisting in pain control.  Pt heart rate 135, when asked, slight centralized chest pain, "a little sob."  Denies N/V/D/ diaphoresis or LOC.    Pt was discharged from the hospital after being treated for pneumonia two weeks ago.

## 2017-12-05 NOTE — Progress Notes (Addendum)
Pharmacy Antibiotic Note  Olivia CoddingtonCantranece Charles is a 27 y.o. female admitted on 12/05/2017 with pneumonia.  Patient recently completed coarse of azithromycin and flagyl. WBC 19.1, LA 2.91, afebrile. Pharmacy has been consulted for cefepime and vancomycin dosing.  Plan: Vancomycin 1000mg  IV every 24 hours.  Goal trough 15-20 mcg/mL. Cefepime 1G IV q8 hours Monitor clinical progression and LOT  Height: 4\' 8"  (142.2 cm) Weight: 94 lb (42.6 kg) IBW/kg (Calculated) : 36.3  Temp (24hrs), Avg:97.6 F (36.4 C), Min:97.4 F (36.3 C), Max:97.8 F (36.6 C)  Recent Labs  Lab 11/29/17 1720 11/30/17 0442 12/01/17 0558 12/05/17 0401 12/05/17 1653  WBC 14.9* 11.9* 12.7* 19.1*  --   CREATININE 0.69 0.60 0.59 0.83  --   LATICACIDVEN  --   --   --   --  2.91*    Estimated Creatinine Clearance: 58.9 mL/min (by C-G formula based on SCr of 0.83 mg/dL).    No Known Allergies  Thank you for allowing pharmacy to be a part of this patient's care.  Olivia Charles 12/05/2017 5:24 PM

## 2017-12-05 NOTE — ED Provider Notes (Signed)
Took over patient care from Belkatiyana GeorgiaPA and Dr. Freida BusmanAllen.  Patient's abdominal CT returned within normal limits.  Spoke with CT surgery and 2 separate CT surgeons evaluated the patient CT and they have never seen anatomy like this.  There is no hard evidence at this time that her reconstruction is leaking.  Spoke with radiology as well and he does not see any evidence of leaking however he states there is a persistent left pleural effusion and pneumonia.  Spoke about doing a Gastrografin upper GI but given the dilation of her esophagus because it is made from colon may not be extremely fruitful because of the amount of contrast it would take to fill this area.  Might be more helpful to do an aspiration of the fluid to look for gastric secretions or other bacteria's.  Patient's lactic acid is still elevated at 2.9 and that was after fluid.  White count of 19,000 and persistent tachycardia concern for persistent pneumonia.  Patient covered with healthcare associated antibiotics as she was just on a course of community-acquired and symptoms have persisted.  Patient is still complaining of pain but oxygen saturation is within normal limits.   Gwyneth SproutPlunkett, Olivia Bradstreet, MD 12/05/17 1726

## 2017-12-05 NOTE — ED Notes (Signed)
Report attempted 

## 2017-12-05 NOTE — ED Provider Notes (Signed)
Medical screening examination/treatment/procedure(s) were conducted as a shared visit with non-physician practitioner(s) and myself.  I personally evaluated the patient during the encounter.   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  Denies any change to her stools.  On exam she is tender in her epigastric region without peritoneal signs.  Had a negative chest CT here for PE or pneumonia which read negative.  Abdominal CT is pending at this time   Lorre NickAllen, Monifah Freehling, MD 12/05/17 1301

## 2017-12-05 NOTE — ED Notes (Signed)
I stat lactic acid results given to Dr. Plunkett by B. Lujain Kraszewski, EMT 

## 2017-12-05 NOTE — H&P (Addendum)
History and Physical    Olivia Charles ZOX:[id number] DOB: 1990/12/26 DOA: 12/05/2017  Referring MD/NP/PA: EDP PCP:  Patient coming from: Home  Chief Complaint: Chest/back pain and fever/chills  HPI: Olivia Charles is a 27 y.o. 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 She reports clinically improving on antibiotic course in the hospital and did well for the first couple of days, subsequently on Wednesday onward started having left back pleuritic chest pain and cough which was minimally productive of yellow/brown sputum. In addition she also reported subjective sensations of hot and cold never checked her temperature, though she felt feverish. -She is still heating pad and NSAIDs with mild to moderate benefit however due to worsening of her back/chest pain presented to the emergency room again today where she was noted to be tachycardic, with increasing leukocytosis, mild lactic acidosis. CT angiogram of the chest was negative for pulmonary embolism showed small left pleural effusion and left lower lobe atelectasis. ED Course: EDP discussed with radiologist, who thinks she still has left lower lobe pneumonia and small adjoining pleural effusion, no rupture or leak noted on CTA chest and abdomen pelvis   Review of Systems: As per HPI otherwise 14 point review of systems negative.   History reviewed. No pertinent past medical history.  Past Surgical History:  Procedure Laterality Date  . BACK SURGERY    . ESOPHAGEAL RECONSTRUCTION       reports that  has never smoked. she has never used smokeless tobacco. She reports that she drinks  alcohol. She reports that she does not use drugs.  No Known Allergies  Family History  Problem Relation Age of Onset  . Hypertension Other      Prior to Admission medications   Medication Sig Start Date End Date Taking? Authorizing Provider  azithromycin (ZITHROMAX) 500 MG tablet Take 1 tablet (500 mg total) by mouth daily. 12/01/17   Haydee Salter, MD  famotidine (PEPCID) 20 MG tablet Take 1 tablet (20 mg total) by mouth 2 (two) times daily for 7 days. 12/01/17 12/08/17  Haydee Salter, MD  guaiFENesin (MUCINEX) 600 MG 12 hr tablet Take 1 tablet (600 mg total) by mouth 2 (two) times daily for 7 days. 12/01/17 12/08/17  Haydee Salter, MD  ibuprofen (ADVIL,MOTRIN) 200 MG tablet Take 400 mg by mouth every 6 (six) hours as needed for headache (pain).    [provider]  MedroxyPROGESTERone Acetate 150 MG/ML SUSY Inject 150 mg into the muscle every 3 (three) months. Next injection due 12/03/17 09/16/17   [provider]  metroNIDAZOLE (FLAGYL) 500 MG tablet Take 1 tablet (500 mg total) by mouth every 8 (eight) hours. 12/01/17   Haydee Salter, MD  omeprazole (PRILOSEC) 20 MG capsule Take 20 mg by mouth daily. 11/21/17   [provider]  ondansetron (ZOFRAN) 4 MG tablet Take 1 tablet (4 mg total) by mouth every 6 (six) hours as needed for nausea. 12/01/17   Haydee Salter, MD  Tetrahydrozoline HCl (VISINE OP) Place 1 drop into both eyes daily as needed (irritation).    [provider]  valACYclovir (VALTREX) 500 MG tablet Take 500 mg by mouth daily.    [provider]    Physical Exam:  Vitals:   12/05/17 1430 12/05/17 1500 12/05/17 1600 12/05/17 1700  BP: (!) 114/93 110/80 130/87 119/87  Pulse: (!) 125 (!) 126 (!) 120 (!) 123  Resp: (!) 21  (!) 27 (!) 22  Temp:      TempSrc:      SpO2: 98% 99% 100% 99%  Weight:      Height:          Constitutional: Frail thinly built female, laying in bed, uncomfortable appearing  Vitals:   12/05/17 1430  12/05/17 1500 12/05/17 1600 12/05/17 1700  BP: (!) 114/93 110/80 130/87 119/87  Pulse: (!) 125 (!) 126 (!) 120 (!) 123  Resp: (!) 21  (!) 27 (!) 22  Temp:      TempSrc:      SpO2: 98% 99% 100% 99%  Weight:      Height:       Eyes: PERRL, lids and conjunctivae normal ENMT:  oral mucosa dry and pink  Neck: normal, supple Respiratory:  decreased breath sounds in the left base, surgical scar noted at the base of the neck anteriorly  Cardiovascular: Regular rate and rhythm, no murmurs / rubs / gallops Abdomen: soft, non tender, Bowel sounds positive.  Musculoskeletal: No joint deformity upper and lower extremities. Ext:  no edema clubbing or cyanosis  Skin: no rashes, lesions, ulcers.  Neurologic: CN 2-12 grossly intact. Sensation intact, DTR normal. Strength 5/5 in all 4.  Psychiatric: Normal judgment and insight. Alert and oriented x 3. Normal mood.   Labs on Admission: I have personally reviewed following labs and imaging studies  CBC: Recent Labs  Lab 11/29/17 1720 11/30/17 0442 12/01/17 0558 12/05/17 0401  WBC 14.9* 11.9* 12.7* 19.1*  NEUTROABS  --  8.2* 10.2*  --   HGB 12.0 11.5* 12.3 11.9*  HCT 35.6* 33.7* 35.9* 35.4*  MCV 86.8 87.5 87.1 87.0  PLT 253 256 265 258   Basic Metabolic Panel: Recent Labs  Lab 11/29/17 1720 11/30/17 0442 12/01/17 0558 12/05/17 0401  NA 138 137 132* 134*  K 3.7 3.7 4.0 3.6  CL 104 104 98* 97*  CO2 22 22 23  21*  GLUCOSE 90 84 112* 141*  BUN 15 11 9 11   CREATININE 0.69 0.60 0.59 0.83  CALCIUM 8.7* 8.5* 8.9 9.0   GFR: Estimated Creatinine Clearance: 58.9 mL/min (by C-G formula based on SCr of 0.83 mg/dL). Liver Function Tests: Recent Labs  Lab 11/30/17 0442 12/05/17 1400  AST 16 30  ALT 13* 31  ALKPHOS 49 48  BILITOT 0.5 0.6  PROT 5.8* 6.9  ALBUMIN 3.3* 4.2   Recent Labs  Lab 12/05/17 1400  LIPASE 37   No results for input(s): AMMONIA in the last 168 hours. Coagulation Profile: No results for input(s): INR, PROTIME  in the last 168 hours. Cardiac Enzymes: No results for input(s): CKTOTAL, CKMB, CKMBINDEX, TROPONINI in the last 168 hours. BNP (last 3 results) No results for input(s): PROBNP in the last 8760 hours. HbA1C: No results for input(s): HGBA1C in the last 72 hours. CBG: No results for input(s): GLUCAP in the last 168 hours. Lipid Profile: No results for input(s): CHOL, HDL, LDLCALC, TRIG, CHOLHDL, LDLDIRECT in the last 72 hours. Thyroid Function Tests: Recent Labs    12/05/17 1504  TSH 1.151   Anemia Panel: No results for input(s): VITAMINB12, FOLATE, FERRITIN, TIBC, IRON, RETICCTPCT in the last 72 hours. Urine analysis:    Component Value Date/Time   COLORURINE YELLOW 01/29/2012 1922   APPEARANCEUR CLEAR 01/29/2012 1922  LABSPEC 1.031 (H) 01/29/2012 1922   PHURINE 6.5 01/29/2012 1922   GLUCOSEU NEGATIVE 01/29/2012 1922   HGBUR NEGATIVE 01/29/2012 1922   BILIRUBINUR NEGATIVE 01/29/2012 1922   KETONESUR 40 (A) 01/29/2012 1922   PROTEINUR 30 (A) 01/29/2012 1922   UROBILINOGEN 1.0 01/29/2012 1922   NITRITE NEGATIVE 01/29/2012 1922   LEUKOCYTESUR NEGATIVE 01/29/2012 1922   Sepsis Labs: @LABRCNTIP (procalcitonin:4,lacticidven:4) ) Recent Results (from the past 240 hour(s))  Blood culture (routine x 2)     Status: None   Collection Time: 11/29/17 10:00 PM  Result Value Ref Range Status   Specimen Description BLOOD LEFT ANTECUBITAL  Final   Special Requests   Final    BOTTLES DRAWN AEROBIC AND ANAEROBIC Blood Culture adequate volume   Culture   Final    NO GROWTH 5 DAYS Performed at [facility], [address] [place], [place] [address]    Report Status 12/04/2017 FINAL  Final  Blood culture (routine x 2)     Status: None   Collection Time: 11/29/17 10:10 PM  Result Value Ref Range Status   Specimen Description BLOOD RIGHT ANTECUBITAL  Final   Special Requests   Final    BOTTLES DRAWN AEROBIC AND ANAEROBIC Blood Culture adequate volume   Culture   Final    NO  GROWTH 5 DAYS Performed at [facility], [address] [place], [place] [address]    Report Status 12/04/2017 FINAL  Final     Radiological Exams on Admission: Dg Chest 2 View  Result Date: 12/05/2017 CLINICAL DATA:  Acute onset of central chest pain and mild shortness of breath. Upper back pain. EXAM: CHEST  2 VIEW COMPARISON:  CTA of the chest performed 11/29/2017 FINDINGS: As previously noted, the patient is status post esophageal reconstruction using the colon. The colonic loop is largely filled with air, extending over the left hemithorax. No superimposed focal airspace consolidation is seen. No pleural effusion or pneumothorax is identified. The heart is normal in size. Thoracolumbar spinal fusion hardware is partially imaged. IMPRESSION: No acute cardiopulmonary process seen. Status post esophageal reconstruction using the colon. Electronically Signed   By: Roanna RaiderJeffery  Chang M.D.   On: 12/05/2017 04:45   Ct Angio Chest Pe W And/or Wo Contrast  Result Date: 12/05/2017 CLINICAL DATA:  Chest pain, shortness of breath. EXAM: CT ANGIOGRAPHY CHEST WITH CONTRAST TECHNIQUE: Multidetector CT imaging of the chest was performed using the standard protocol during bolus administration of intravenous contrast. Multiplanar CT image reconstructions and MIPs were obtained to evaluate the vascular anatomy. CONTRAST:  100mL ISOVUE-370 IOPAMIDOL (ISOVUE-370) INJECTION 76% COMPARISON:  Radiographs of same day.  CT scan of November 29, 2017. FINDINGS: Cardiovascular: Satisfactory opacification of the pulmonary arteries to the segmental level. No evidence of pulmonary embolism. Normal heart size. No pericardial effusion. There is no evidence of thoracic aortic dissection or aneurysm. Mediastinum/Nodes: There is continued presence of anterior left diaphragmatic hernia with large loop of colon extending to the left lung apex. Air collection is again noted anterior to the sternum which may be related to esophageal  reconstruction. Thyroid gland is unremarkable. No definite mediastinal adenopathy is noted. Lungs/Pleura: No pneumothorax is noted. Right lung is clear. Mild left pleural effusion is noted with left lower lobe atelectasis present. Upper Abdomen: Continued presence of left diaphragmatic hernia as described above. Musculoskeletal: Status post surgical fusion of thoracic and visualized lumbar spine. No acute abnormality is noted. Review of the MIP images confirms the above findings. IMPRESSION: No definite  evidence of pulmonary embolus. Continued presence of anterior left diaphragmatic hernia with large loop of colon is seen extending as superiorly as left lung apex. Air collection is noted anterior to the sternum which may be related to esophageal reconstruction. Mild left pleural effusion is noted with left lower lobe atelectasis. Electronically Signed   By: Lupita Raider, M.D.   On: 12/05/2017 12:41   Ct Abdomen Pelvis W Contrast  Result Date: 12/05/2017 CLINICAL DATA:  Abdominal distention. EXAM: CT ABDOMEN AND PELVIS WITH CONTRAST TECHNIQUE: Multidetector CT imaging of the abdomen and pelvis was performed using the standard protocol following bolus administration of intravenous contrast. CONTRAST:  ISOVUE-300 IOPAMIDOL (ISOVUE-300) INJECTION 61% COMPARISON:  None. FINDINGS: Lower chest: Unchanged moderate left pleural effusion left lower lobe atelectasis. Unchanged esophageal reconstruction using a loop of colon within the left hemithorax. Hepatobiliary: No focal liver abnormality is seen. No gallstones, gallbladder wall thickening, or biliary dilatation. Pancreas: Unremarkable. No pancreatic ductal dilatation or surrounding inflammatory changes. Spleen: Normal in size without focal abnormality. Adrenals/Urinary Tract: Streak artifact from lumbar hardware limits evaluation. The adrenal glands and kidneys are grossly unremarkable. Excreted contrast with the renal collecting systems and bladder. No  hydronephrosis. Stomach/Bowel: No bowel wall thickening, distention, or surrounding inflammatory changes. Vascular/Lymphatic: No significant vascular findings are present. No enlarged abdominal or pelvic lymph nodes. Reproductive: Uterus and bilateral adnexa are unremarkable. Other: No free fluid or pneumoperitoneum. Musculoskeletal: Lower lumbar scoliosis status post Harrington rod placement and lumbar fusion. No acute osseous abnormality IMPRESSION: 1.  No acute intra-abdominal process. 2. Postsurgical changes related to esophageal reconstruction, using a loop of colon within the left hemithorax. Electronically Signed   By: Obie Dredge M.D.   On: 12/05/2017 16:35    EKG: I have Independently reviewed this, Sinus tachycardia no acute ST-T wave changes   Assessment/Plan Active Problems:   Healthcare-associated pneumonia   Chest pain/pleurisy -Suspect her chest pain, cough and leukocytosis are likely related to known pneumonia and pleural effusion with associated pleurisy -Did not agree with antibiotic choice at discharge on Sunday hence unable to call this failure of treatment -Broad-spectrum antibiotics for now, hydration with IV fluids -Follow-up blood cultures, sputum cultures if possible, check urine Legionella antigen and pneumococcal antigen -Influenza PCR is negative -Low-dose naproxen twice a day for pleurisy -Repeat chest x-ray in 24-48 hours -If pleural effusion worsens would benefit from a diagnostic paracentesis, but at this time I suspect is purely parapneumonic  Remote 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

## 2017-12-06 ENCOUNTER — Other Ambulatory Visit: Payer: Self-pay

## 2017-12-06 ENCOUNTER — Inpatient Hospital Stay (HOSPITAL_COMMUNITY): Payer: 59

## 2017-12-06 ENCOUNTER — Encounter (HOSPITAL_COMMUNITY): Payer: Self-pay

## 2017-12-06 DIAGNOSIS — R0600 Dyspnea, unspecified: Secondary | ICD-10-CM

## 2017-12-06 DIAGNOSIS — J189 Pneumonia, unspecified organism: Secondary | ICD-10-CM

## 2017-12-06 HISTORY — PX: IR THORACENTESIS ASP PLEURAL SPACE W/IMG GUIDE: IMG5380

## 2017-12-06 LAB — BODY FLUID CELL COUNT WITH DIFFERENTIAL
Eos, Fluid: 0 %
Lymphs, Fluid: 1 %
Monocyte-Macrophage-Serous Fluid: 19 % — ABNORMAL LOW (ref 50–90)
NEUTROPHIL FLUID: 80 % — AB (ref 0–25)
WBC FLUID: 15300 uL — AB (ref 0–1000)

## 2017-12-06 LAB — EXPECTORATED SPUTUM ASSESSMENT W GRAM STAIN, RFLX TO RESP C

## 2017-12-06 LAB — COMPREHENSIVE METABOLIC PANEL
ALBUMIN: 2.9 g/dL — AB (ref 3.5–5.0)
ALT: 19 U/L (ref 14–54)
ANION GAP: 10 (ref 5–15)
AST: 15 U/L (ref 15–41)
Alkaline Phosphatase: 35 U/L — ABNORMAL LOW (ref 38–126)
BILIRUBIN TOTAL: 0.6 mg/dL (ref 0.3–1.2)
BUN: <5 mg/dL — ABNORMAL LOW (ref 6–20)
CHLORIDE: 103 mmol/L (ref 101–111)
CO2: 21 mmol/L — ABNORMAL LOW (ref 22–32)
Calcium: 7.9 mg/dL — ABNORMAL LOW (ref 8.9–10.3)
Creatinine, Ser: 0.57 mg/dL (ref 0.44–1.00)
GFR calc Af Amer: >60 mL/min (ref >60)
GLUCOSE: 131 mg/dL — AB (ref 65–99)
POTASSIUM: 3.7 mmol/L (ref 3.5–5.1)
Sodium: 134 mmol/L — ABNORMAL LOW (ref 135–145)
TOTAL PROTEIN: 4.9 g/dL — AB (ref 6.5–8.1)

## 2017-12-06 LAB — CBC
HEMATOCRIT: 29 % — AB (ref 36.0–46.0)
Hemoglobin: 10 g/dL — ABNORMAL LOW (ref 12.0–15.0)
MCH: 30.5 pg (ref 26.0–34.0)
MCHC: 34.5 g/dL (ref 30.0–36.0)
MCV: 88.4 fL (ref 78.0–100.0)
PLATELETS: 204 10*3/uL (ref 150–400)
RBC: 3.28 MIL/uL — ABNORMAL LOW (ref 3.87–5.11)
RDW: 14.7 % (ref 11.5–15.5)
WBC: 18.6 10*3/uL — ABNORMAL HIGH (ref 4.0–10.5)

## 2017-12-06 LAB — STREP PNEUMONIAE URINARY ANTIGEN: STREP PNEUMO URINARY ANTIGEN: NEGATIVE

## 2017-12-06 LAB — LACTATE DEHYDROGENASE: LDH: 166 U/L (ref 98–192)

## 2017-12-06 LAB — GLUCOSE, PLEURAL OR PERITONEAL FLUID: GLUCOSE FL: 106 mg/dL

## 2017-12-06 LAB — EXPECTORATED SPUTUM ASSESSMENT W REFEX TO RESP CULTURE: SPECIAL REQUESTS: NORMAL

## 2017-12-06 LAB — ECHOCARDIOGRAM COMPLETE
Height: 56 in
Weight: 1527.35 oz

## 2017-12-06 LAB — LACTATE DEHYDROGENASE, PLEURAL OR PERITONEAL FLUID: LD, Fluid: 370 U/L — ABNORMAL HIGH (ref 3–23)

## 2017-12-06 LAB — PROTEIN, PLEURAL OR PERITONEAL FLUID

## 2017-12-06 LAB — PROTEIN, TOTAL: Total Protein: 5 g/dL — ABNORMAL LOW (ref 6.5–8.1)

## 2017-12-06 MED ORDER — LIDOCAINE HCL (PF) 1 % IJ SOLN
INTRAMUSCULAR | Status: DC | PRN
  Administered 2017-12-06: 10 mL

## 2017-12-06 MED ORDER — INFLUENZA VAC SPLIT QUAD 0.5 ML IM SUSY
0.5000 mL | PREFILLED_SYRINGE | INTRAMUSCULAR | Status: AC
  Administered 2017-12-07: 0.5 mL via INTRAMUSCULAR
  Filled 2017-12-06: qty 0.5

## 2017-12-06 MED ORDER — PNEUMOCOCCAL VAC POLYVALENT 25 MCG/0.5ML IJ INJ
0.5000 mL | INJECTION | INTRAMUSCULAR | Status: AC
  Administered 2017-12-07: 0.5 mL via INTRAMUSCULAR
  Filled 2017-12-06: qty 0.5

## 2017-12-06 MED ORDER — IOPAMIDOL (ISOVUE-300) INJECTION 61%
INTRAVENOUS | Status: AC
  Filled 2017-12-06: qty 150

## 2017-12-06 MED ORDER — MORPHINE SULFATE (PF) 4 MG/ML IV SOLN
2.0000 mg | INTRAVENOUS | Status: DC | PRN
  Administered 2017-12-06 – 2017-12-07 (×5): 2 mg via INTRAVENOUS
  Filled 2017-12-06 (×6): qty 1

## 2017-12-06 MED ORDER — LIDOCAINE HCL 1 % IJ SOLN
INTRAMUSCULAR | Status: AC
  Filled 2017-12-06: qty 20

## 2017-12-06 MED ORDER — METOPROLOL TARTRATE 5 MG/5ML IV SOLN
5.0000 mg | Freq: Four times a day (QID) | INTRAVENOUS | Status: AC
  Administered 2017-12-06 (×2): 5 mg via INTRAVENOUS
  Filled 2017-12-06 (×2): qty 5

## 2017-12-06 NOTE — Progress Notes (Signed)
PROGRESS NOTE    Olivia Charles  WUJ:[id number] DOB: [birth year] DOA: 12/05/2017 PCP: [facility]  Brief Narrative:Olivia Charles is a 27 y.o. 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 She reports clinically improving on antibiotic course in the hospital and did well for the first couple of days, subsequently on Wednesday onward started having left back pleuritic chest pain and cough which was minimally productive of yellow/brown sputum. In addition she also reported subjective sensations of hot and cold never checked her temperature, though she felt feverish. -She used a heating pad and NSAIDs with mild to moderate benefit however due to worsening of her back/chest pain presented to the emergency room again today where she was noted to be tachycardic, with increasing leukocytosis, mild lactic acidosis. CT angiogram of the chest was negative for pulmonary embolism showed small left pleural effusion and left lower lobe atelectasis. ED Course: EDP discussed with radiologist, who thinks she still has left lower lobe pneumonia and small adjoining pleural effusion, no rupture or leak noted on CTA chest and abdomen pelvis   Assessment & Plan:   1. L Pleural effusion -likely para pneumonic based on recent Pneumonia, yesterday this was small based on CT chest will repeat CXR today due to worsening dyspnea and diminished BS to determine if worsening pleural effusion -if so will need thoracentesis -has associated pleurisy -continue Broad spectrum Abx for now,  -HIV negative, Urine Legionella and pneumo Ag negative -de-escalate  Abx in 24-48h Addendum: 1pm, CXR notes worsening L pleural effusion, needs Thoracentesis, Pulm consulted  2. H/o Esophageal reconstruction as an infant using Colon Segment -CTA chest and Ba esophagram without leak or obstruction -no symptoms of dysphagia or odynophagia  DVT prophylaxis:lovenox Code Status: Full Code Family Communication: None at bedside Disposition Plan: Home pending clinical improvement  Consultants:   Pulm   Procedures:   Antimicrobials:    Subjective: -chest pain better, more dyspneic today Objective: Vitals:   12/05/17 2158 12/05/17 2200 12/05/17 2250 12/06/17 0619  BP: 126/87 115/81 (!) 129/92 127/88  Pulse:   (!) 120 (!) 121  Resp:    16  Temp:   97.8 F (36.6 C) 98.1 F (36.7 C)  TempSrc:   Oral Oral  SpO2:  99% 100% 99%  Weight:   43.3 kg (95 lb 7.4 oz)   Height:   4\' 8"  (1.422 m)     Intake/Output Summary (Last 24 hours) at 12/06/2017 1258 Last data filed at 12/06/2017 0943 Gross per 24 hour  Intake 1941.25 ml  Output -  Net 1941.25 ml   Filed Weights   12/05/17 0354 12/05/17 2250  Weight: 42.6 kg (94 lb) 43.3 kg (95 lb 7.4 oz)    Examination:  General exam: Appears calm and uncomfortable, laying in bed Respiratory system: decreased BS on left Cardiovascular system: S1 & S2 heard, RRR. No JVD, murmurs Gastrointestinal system: Abdomen is nondistended, soft and nontender.Normal bowel sounds heard. Central nervous system: Alert and oriented. No focal neurological deficits. Extremities: Symmetric 5 x 5 power. Skin: No rashes, lesions or ulcers Psychiatry: Judgement and insight appear normal. Mood & affect appropriate.     Data Reviewed:   CBC: Recent Labs  Lab 11/30/17 0442 12/01/17 0558 12/05/17 0401  12/05/17 2255 12/06/17 0513  WBC 11.9* 12.7* 19.1* 19.7* 18.6*  NEUTROABS 8.2* 10.2*  --   --   --   HGB 11.5* 12.3 11.9* 10.2* 10.0*  HCT 33.7* 35.9* 35.4* 30.1* 29.0*  MCV 87.5 87.1 87.0 87.8 88.4  PLT 256 265 258 269 204    Basic Metabolic Panel: Recent Labs  Lab 11/29/17 1720 11/30/17 0442 12/01/17 0558 12/05/17 0401 12/05/17 2255 12/06/17 0513  NA 138 137 132* 134*  --  134*  K 3.7 3.7 4.0 3.6  --  3.7  CL 104 104 98* 97*  --  103  CO2 22 22 23  21*  --  21*  GLUCOSE 90 84 112* 141*  --  131*  BUN 15 11 9 11   --  <5*  CREATININE 0.69 0.60 0.59 0.83 0.66 0.57  CALCIUM 8.7* 8.5* 8.9 9.0  --  7.9*   GFR: Estimated Creatinine Clearance: 61.1 mL/min (by C-G formula based on SCr of 0.57 mg/dL). Liver Function Tests: Recent Labs  Lab 11/30/17 0442 12/05/17 1400 12/06/17 0513  AST 16 30 15   ALT 13* 31 19  ALKPHOS 49 48 35*  BILITOT 0.5 0.6 0.6  PROT 5.8* 6.9 4.9*  ALBUMIN 3.3* 4.2 2.9*   Recent Labs  Lab 12/05/17 1400  LIPASE 37   No results for input(s): AMMONIA in the last 168 hours. Coagulation Profile: No results for input(s): INR, PROTIME in the last 168 hours. Cardiac Enzymes: No results for input(s): CKTOTAL, CKMB, CKMBINDEX, TROPONINI in the last 168 hours. BNP (last 3 results) No results for input(s): PROBNP in the last 8760 hours. HbA1C: No results for input(s): HGBA1C in the last 72 hours. CBG: No results for input(s): GLUCAP in the last 168 hours. Lipid Profile: No results for input(s): CHOL, HDL, LDLCALC, TRIG, CHOLHDL, LDLDIRECT in the last 72 hours. Thyroid Function Tests: Recent Labs    12/05/17 1504  TSH 1.151   Anemia Panel: No results for input(s): VITAMINB12, FOLATE, FERRITIN, TIBC, IRON, RETICCTPCT in the last 72 hours. Urine analysis:    Component Value Date/Time   COLORURINE YELLOW 01/29/2012 1922   APPEARANCEUR CLEAR 01/29/2012 1922   LABSPEC 1.031 (H) 01/29/2012 1922   PHURINE 6.5 01/29/2012 1922   GLUCOSEU NEGATIVE 01/29/2012 1922   HGBUR NEGATIVE 01/29/2012 1922   BILIRUBINUR NEGATIVE 01/29/2012 1922   KETONESUR 40 (A) 01/29/2012 1922   PROTEINUR 30 (A) 01/29/2012 1922   UROBILINOGEN 1.0 01/29/2012 1922   NITRITE NEGATIVE 01/29/2012 1922    LEUKOCYTESUR NEGATIVE 01/29/2012 1922   Sepsis Labs: @LABRCNTIP (procalcitonin:4,lacticidven:4)  ) Recent Results (from the past 240 hour(s))  Blood culture (routine x 2)     Status: None   Collection Time: 11/29/17 10:00 PM  Result Value Ref Range Status   Specimen Description BLOOD LEFT ANTECUBITAL  Final   Special Requests   Final    BOTTLES DRAWN AEROBIC AND ANAEROBIC Blood Culture adequate volume   Culture   Final    NO GROWTH 5 DAYS Performed at [facility], [address] [place], [place] [address]    Report Status 12/04/2017 FINAL  Final  Blood culture (routine x 2)     Status: None   Collection Time: 11/29/17 10:10 PM  Result Value Ref Range Status   Specimen Description BLOOD RIGHT ANTECUBITAL  Final   Special Requests   Final    BOTTLES DRAWN AEROBIC AND ANAEROBIC Blood Culture adequate volume   Culture   Final    NO GROWTH 5 DAYS  Performed at [facility], [address] [place], [place] [address]    Report Status 12/04/2017 FINAL  Final  Culture, expectorated sputum-assessment     Status: None (Preliminary result)   Collection Time: 12/05/17 11:01 PM  Result Value Ref Range Status   Specimen Description EXPECTORATED SPUTUM  Final   Special Requests   Final    Normal Performed at [facility], [address] [place], [place] [address]    Sputum evaluation THIS SPECIMEN IS ACCEPTABLE FOR SPUTUM CULTURE  Final   Report Status PENDING  Incomplete  Culture, respiratory (NON-Expectorated)     Status: None (Preliminary result)   Collection Time: 12/05/17 11:01 PM  Result Value Ref Range Status   Specimen Description EXPECTORATED SPUTUM  Final   Special Requests Normal Reflexed from [id number]  Final   Gram Stain   Final    RARE WBC PRESENT, PREDOMINANTLY PMN RARE SQUAMOUS EPITHELIAL CELLS PRESENT RARE GRAM POSITIVE COCCI    Culture PENDING  Incomplete   Report Status PENDING  Incomplete         Radiology Studies: Dg Chest 2  View  Result Date: 12/06/2017 CLINICAL DATA:  Pleural effusion. EXAM: CHEST  2 VIEW COMPARISON:  Radiographs of December 05, 2017. FINDINGS: There is interval development of moderate left pleural effusion. No pneumothorax is noted. Minimal right basilar subsegmental atelectasis is noted. Continued presence of bowel seen projected over the left hemithorax. Harrington rods are seen involving the thoracic spine and lumbar spine. Stable dextroscoliosis of thoracic spine is noted. IMPRESSION: Interval development of moderate left pleural effusion, with continued presence of bowel projected over the left hemithorax. Electronically Signed   By: Lupita RaiderJames  Green Jr, M.D.   On: 12/06/2017 10:28   Dg Chest 2 View  Result Date: 12/05/2017 CLINICAL DATA:  Acute onset of central chest pain and mild shortness of breath. Upper back pain. EXAM: CHEST  2 VIEW COMPARISON:  CTA of the chest performed 11/29/2017 FINDINGS: As previously noted, the patient is status post esophageal reconstruction using the colon. The colonic loop is largely filled with air, extending over the left hemithorax. No superimposed focal airspace consolidation is seen. No pleural effusion or pneumothorax is identified. The heart is normal in size. Thoracolumbar spinal fusion hardware is partially imaged. IMPRESSION: No acute cardiopulmonary process seen. Status post esophageal reconstruction using the colon. Electronically Signed   By: Roanna RaiderJeffery  Chang M.D.   On: 12/05/2017 04:45   Ct Angio Chest Pe W And/or Wo Contrast  Result Date: 12/05/2017 CLINICAL DATA:  Chest pain, shortness of breath. EXAM: CT ANGIOGRAPHY CHEST WITH CONTRAST TECHNIQUE: Multidetector CT imaging of the chest was performed using the standard protocol during bolus administration of intravenous contrast. Multiplanar CT image reconstructions and MIPs were obtained to evaluate the vascular anatomy. CONTRAST:  100mL ISOVUE-370 IOPAMIDOL (ISOVUE-370) INJECTION 76% COMPARISON:  Radiographs of  same day.  CT scan of November 29, 2017. FINDINGS: Cardiovascular: Satisfactory opacification of the pulmonary arteries to the segmental level. No evidence of pulmonary embolism. Normal heart size. No pericardial effusion. There is no evidence of thoracic aortic dissection or aneurysm. Mediastinum/Nodes: There is continued presence of anterior left diaphragmatic hernia with large loop of colon extending to the left lung apex. Air collection is again noted anterior to the sternum which may be related to esophageal reconstruction. Thyroid gland is unremarkable. No definite mediastinal adenopathy is noted. Lungs/Pleura: No pneumothorax is noted. Right lung is clear. Mild left pleural effusion is noted with left lower  lobe atelectasis present. Upper Abdomen: Continued presence of left diaphragmatic hernia as described above. Musculoskeletal: Status post surgical fusion of thoracic and visualized lumbar spine. No acute abnormality is noted. Review of the MIP images confirms the above findings. IMPRESSION: No definite evidence of pulmonary embolus. Continued presence of anterior left diaphragmatic hernia with large loop of colon is seen extending as superiorly as left lung apex. Air collection is noted anterior to the sternum which may be related to esophageal reconstruction. Mild left pleural effusion is noted with left lower lobe atelectasis. Electronically Signed   By: Lupita Raider, M.D.   On: 12/05/2017 12:41   Ct Abdomen Pelvis W Contrast  Result Date: 12/05/2017 CLINICAL DATA:  Abdominal distention. EXAM: CT ABDOMEN AND PELVIS WITH CONTRAST TECHNIQUE: Multidetector CT imaging of the abdomen and pelvis was performed using the standard protocol following bolus administration of intravenous contrast. CONTRAST:  ISOVUE-300 IOPAMIDOL (ISOVUE-300) INJECTION 61% COMPARISON:  None. FINDINGS: Lower chest: Unchanged moderate left pleural effusion left lower lobe atelectasis. Unchanged esophageal reconstruction  using a loop of colon within the left hemithorax. Hepatobiliary: No focal liver abnormality is seen. No gallstones, gallbladder wall thickening, or biliary dilatation. Pancreas: Unremarkable. No pancreatic ductal dilatation or surrounding inflammatory changes. Spleen: Normal in size without focal abnormality. Adrenals/Urinary Tract: Streak artifact from lumbar hardware limits evaluation. The adrenal glands and kidneys are grossly unremarkable. Excreted contrast with the renal collecting systems and bladder. No hydronephrosis. Stomach/Bowel: No bowel wall thickening, distention, or surrounding inflammatory changes. Vascular/Lymphatic: No significant vascular findings are present. No enlarged abdominal or pelvic lymph nodes. Reproductive: Uterus and bilateral adnexa are unremarkable. Other: No free fluid or pneumoperitoneum. Musculoskeletal: Lower lumbar scoliosis status post Harrington rod placement and lumbar fusion. No acute osseous abnormality IMPRESSION: 1.  No acute intra-abdominal process. 2. Postsurgical changes related to esophageal reconstruction, using a loop of colon within the left hemithorax. Electronically Signed   By: Obie Dredge M.D.   On: 12/05/2017 16:35   Dg Esophagus  Result Date: 12/06/2017 CLINICAL DATA:  History of esophageal reconstruction with a segment of colon. Chest pain and neck pain. Gas collection within the left side of the neck. Rule out leak. EXAM: ESOPHOGRAM/BARIUM SWALLOW TECHNIQUE: Single contrast examination was performed using water-soluble contrast. FLUOROSCOPY TIME:  Fluoroscopy Time:  2 min 6 sec Radiation Exposure Index (if provided by the fluoroscopic device): 18.3 mGy Number of Acquired Spot Images: 0 COMPARISON:  CT 12/05/2017 FINDINGS: Water-soluble swallow was performed. This shows filling of a large dilated sac-like pouch in the left side of the neck. This communicates freely with the lumen above and below. There is stasis of contrast within this sac-like  structure. When the patient squeeze is this area with her hand, contrast flows into the lumen. No visible leak or obstruction. 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 There is stasis of contrast within this pouch. No evidence of leak. Electronically Signed   By: Charlett Nose M.D.   On: 12/06/2017 08:33        Scheduled Meds: . enoxaparin (LOVENOX) injection  30 mg Subcutaneous Q24H  . gi cocktail  30 mL Oral Once  . [START ON 12/07/2017] Influenza vac split quadrivalent PF  0.5 mL Intramuscular Tomorrow-1000  . iopamidol      . pantoprazole  40 mg Oral Q1200  . [START ON 12/07/2017] pneumococcal 23 valent vaccine  0.5 mL Intramuscular Tomorrow-1000  Continuous Infusions: . sodium chloride 10 mL/hr at 12/06/17 1039  . ceFEPime (MAXIPIME) IV Stopped (12/06/17 0722)  . vancomycin Stopped (12/06/17 1141)     LOS: 1 day    Time spent:    Zannie Cove, MD [facility] Page via [email or web address], password [email or web address] After 7PM please contact night-coverage  12/06/2017, 12:58 PM

## 2017-12-06 NOTE — Progress Notes (Signed)
Pt admitted to room. Alert and oriented x 4. 2L O2 via [place] for comfort. Sputum sample collected. Oriented to room and call light.

## 2017-12-06 NOTE — Progress Notes (Signed)
Pt is complaining of chest pain to right chest 10/10. Has lung sounds to both lungs. MD aware. Pt received morphine 2 mg IV . Will continue to monitor.  Colleen Canesar Massa Pe, RN

## 2017-12-06 NOTE — Procedures (Signed)
Ultrasound-guided diagnostic and therapeutic left thoracentesis performed yielding 0.55 liters of brown, thin colored fluid. No immediate complications. Follow-up chest x-ray pending.       Olivia Charles 4:54 PM 12/06/2017

## 2017-12-06 NOTE — Progress Notes (Signed)
Notified X. Blount of HR sustaining 135-145.  BP stable 109/56. Pt denies pain. No new orders at this time.

## 2017-12-06 NOTE — Consult Note (Signed)
PULMONARY / CRITICAL CARE MEDICINE   Name: Olivia Charles MRN: [id number] DOB: 23-Apr-1991    ADMISSION DATE:  12/05/2017 CONSULTATION DATE:  12/06/17  REFERRING MD:  Hospitalist  CHIEF COMPLAINT:  Chest pain  HISTORY OF PRESENT ILLNESS:   Olivia Charles is a 27 y.o. female with medical history significant for severe thoracolumbar scoliosis status post surgery, and esophageal reconstruction as an infant, now presenting to the emergency department for evaluation of cough and chest pain.  Patient reports that she had been in her usual state of health until approximately 1 week ago when she developed a cough, occasionally productive of thick sputum, as well as pain in the central and lower chest with radiation to the back.  There is no alleviating or exacerbating factors identified for her chest pain.  She denies vomiting or dysphasia.  She was seen at an urgent care, esophageal etiology was suspected, and she was started on omeprazole.  Despite this medication, her symptoms persisted, prompting her to seek evaluation today in the ED  Patient noted the onset of shortness of breath and sharp, pleuritic type chest pain after discharge, prompting a return to the hospital. CXR shows opacification and we were asked to assess for a thoracentesis. Patient states she is having some difficulty breathing due to pain. She is awake and alert, and in no distress.   PAST MEDICAL HISTORY :  She  has a past medical history of Asthma.  PAST SURGICAL HISTORY: She  has a past surgical history that includes Esophageal reconstruction and Back surgery.  No Known Allergies  No current facility-administered medications on file prior to encounter.    Current Outpatient Medications on File Prior to Encounter  Medication Sig  . acetaminophen (TYLENOL) 500 MG tablet Take 1,000 mg by mouth every 6 (six) hours as needed.  Marland Kitchen azithromycin (ZITHROMAX) 500 MG tablet Take 1 tablet (500 mg total) by mouth daily.  .  famotidine (PEPCID) 20 MG tablet Take 1 tablet (20 mg total) by mouth 2 (two) times daily for 7 days.  Marland Kitchen guaiFENesin (MUCINEX) 600 MG 12 hr tablet Take 1 tablet (600 mg total) by mouth 2 (two) times daily for 7 days.  . metroNIDAZOLE (FLAGYL) 500 MG tablet Take 1 tablet (500 mg total) by mouth every 8 (eight) hours.  Marland Kitchen omeprazole (PRILOSEC) 20 MG capsule Take 20 mg by mouth daily.  . ondansetron (ZOFRAN) 4 MG tablet Take 1 tablet (4 mg total) by mouth every 6 (six) hours as needed for nausea.  . Tetrahydrozoline HCl (VISINE OP) Place 1 drop into both eyes daily as needed (irritation).  . valACYclovir (VALTREX) 500 MG tablet Take 500 mg by mouth daily.    FAMILY HISTORY:  Her indicated that the status of her other is unknown.   SOCIAL HISTORY: She  reports that  has never smoked. she has never used smokeless tobacco. She reports that she drinks alcohol. She reports that she does not use drugs.  REVIEW OF SYSTEMS:   As per HPI, otherwise negative.  SUBJECTIVE:  Patient is complaining of left sided chest pain.  VITAL SIGNS: BP 129/74   Pulse (!) 121   Temp 98.4 F (36.9 C) (Oral)   Resp 16   Ht 4\' 8"  (1.422 m)   Wt 43.3 kg (95 lb 7.4 oz)   LMP 12/10/2013 (Within Months) Comment: Depo Shot  SpO2 99%   BMI 21.40 kg/m   HEMODYNAMICS:    VENTILATOR SETTINGS:    INTAKE / OUTPUT: I/O last 3  completed shifts: In: 1701.3 [P.O.:120; I.V.:531.3; IV Piggyback:1050] Out: -   PHYSICAL EXAMINATION: General:  Young female, in no distress. Neuro:  Awake, alert and oriented. No motor deficits.      HEENT: Healed scars in the left supraclavicular space. No jvd, adenopathy or mass. No icterus. Cardiovascular:  Normal s1s2, regular rhythm. No murmur, gallop or rub. Lungs:  Right lung is clear to auscultation. Left lung has diminished breath sounds throughout. + bronchophony over the left lower lobe region. No wheezes or rhonchi. Abdomen:  Soft, non tender. No organomegaly or  mass. Musculoskeletal:  No clubbing. Skin:  No cyanosis or pitting edema.  LABS:  BMET Recent Labs  Lab 12/01/17 0558 12/05/17 0401 12/05/17 2255 12/06/17 0513  NA 132* 134*  --  134*  K 4.0 3.6  --  3.7  CL 98* 97*  --  103  CO2 23 21*  --  21*  BUN 9 11  --  <5*  CREATININE 0.59 0.83 0.66 0.57  GLUCOSE 112* 141*  --  131*    Electrolytes Recent Labs  Lab 12/01/17 0558 12/05/17 0401 12/06/17 0513  CALCIUM 8.9 9.0 7.9*    CBC Recent Labs  Lab 12/05/17 0401 12/05/17 2255 12/06/17 0513  WBC 19.1* 19.7* 18.6*  HGB 11.9* 10.2* 10.0*  HCT 35.4* 30.1* 29.0*  PLT 258 269 204    Coag's No results for input(s): APTT, INR in the last 168 hours.  Sepsis Markers Recent Labs  Lab 11/30/17 0012 11/30/17 0442 12/01/17 0558 12/05/17 1653 12/05/17 1908  LATICACIDVEN  --   --   --  2.91* 1.75  PROCALCITON <0.10 <0.10 <0.10  --   --     ABG No results for input(s): PHART, PCO2ART, PO2ART in the last 168 hours.  Liver Enzymes Recent Labs  Lab 11/30/17 0442 12/05/17 1400 12/06/17 0513  AST 16 30 15   ALT 13* 31 19  ALKPHOS 49 48 35*  BILITOT 0.5 0.6 0.6  ALBUMIN 3.3* 4.2 2.9*    Cardiac Enzymes No results for input(s): TROPONINI, PROBNP in the last 168 hours.  Glucose No results for input(s): GLUCAP in the last 168 hours.  Imaging Dg Chest 2 View  Result Date: 12/06/2017 CLINICAL DATA:  Pleural effusion. EXAM: CHEST  2 VIEW COMPARISON:  Radiographs of December 05, 2017. FINDINGS: There is interval development of moderate left pleural effusion. No pneumothorax is noted. Minimal right basilar subsegmental atelectasis is noted. Continued presence of bowel seen projected over the left hemithorax. Harrington rods are seen involving the thoracic spine and lumbar spine. Stable dextroscoliosis of thoracic spine is noted. IMPRESSION: Interval development of moderate left pleural effusion, with continued presence of bowel projected over the left hemithorax.  Electronically Signed   By: Lupita RaiderJames  Green Jr, M.D.   On: 12/06/2017 10:28   Ct Abdomen Pelvis W Contrast  Result Date: 12/05/2017 CLINICAL DATA:  Abdominal distention. EXAM: CT ABDOMEN AND PELVIS WITH CONTRAST TECHNIQUE: Multidetector CT imaging of the abdomen and pelvis was performed using the standard protocol following bolus administration of intravenous contrast. CONTRAST:  100mL ISOVUE-300 IOPAMIDOL (ISOVUE-300) INJECTION 61% COMPARISON:  None. FINDINGS: Lower chest: Unchanged moderate left pleural effusion left lower lobe atelectasis. Unchanged esophageal reconstruction using a loop of colon within the left hemithorax. Hepatobiliary: No focal liver abnormality is seen. No gallstones, gallbladder wall thickening, or biliary dilatation. Pancreas: Unremarkable. No pancreatic ductal dilatation or surrounding inflammatory changes. Spleen: Normal in size without focal abnormality. Adrenals/Urinary Tract: Streak artifact from lumbar hardware limits evaluation.  The adrenal glands and kidneys are grossly unremarkable. Excreted contrast with the renal collecting systems and bladder. No hydronephrosis. Stomach/Bowel: No bowel wall thickening, distention, or surrounding inflammatory changes. Vascular/Lymphatic: No significant vascular findings are present. No enlarged abdominal or pelvic lymph nodes. Reproductive: Uterus and bilateral adnexa are unremarkable. Other: No free fluid or pneumoperitoneum. Musculoskeletal: Lower lumbar scoliosis status post Harrington rod placement and lumbar fusion. No acute osseous abnormality IMPRESSION: 1.  No acute intra-abdominal process. 2. Postsurgical changes related to esophageal reconstruction, using a loop of colon within the left hemithorax. Electronically Signed   By: Obie Dredge M.D.   On: 12/05/2017 16:35   Dg Esophagus  Result Date: 12/06/2017 CLINICAL DATA:  History of esophageal reconstruction with a segment of colon. Chest pain and neck pain. Gas collection within  the left side of the neck. Rule out leak. EXAM: ESOPHOGRAM/BARIUM SWALLOW TECHNIQUE: Single contrast examination was performed using water-soluble contrast. FLUOROSCOPY TIME:  Fluoroscopy Time:  2 min 6 sec Radiation Exposure Index (if provided by the fluoroscopic device): 18.3 mGy Number of Acquired Spot Images: 0 COMPARISON:  CT 12/05/2017 FINDINGS: Water-soluble swallow was performed. This shows filling of a large dilated sac-like pouch in the left side of the neck. This communicates freely with the lumen above and below. There is stasis of contrast within this sac-like structure. When the patient squeeze is this area with her hand, contrast flows into the lumen. No visible leak or obstruction. 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 There is stasis of contrast within this pouch. No evidence of leak. Electronically Signed   By: Charlett Nose M.D.   On: 12/06/2017 08:33     STUDIES:    CULTURES:   ANTIBIOTICS/MEDICATIONS:  Current Facility-Administered Medications:  .  0.9 %  sodium chloride infusion, , Intravenous, Continuous, Zannie Cove, MD, Last Rate: 10 mL/hr at 12/06/17 1039 .  acetaminophen (TYLENOL) tablet 650 mg, 650 mg, Oral, Q6H PRN, 650 mg at 12/06/17 0251 **OR** acetaminophen (TYLENOL) suppository 650 mg, 650 mg, Rectal, Q6H PRN, Zannie Cove, MD .  ceFEPIme (MAXIPIME) 1 g in dextrose 5 % 50 mL IVPB, 1 g, Intravenous, Q8H, Rudisill, Toniann Fail, RPH, Last Rate: 100 mL/hr at 12/06/17 1327, 1 g at 12/06/17 1327 .  enoxaparin (LOVENOX) injection 30 mg, 30 mg, Subcutaneous, Q24H, Zannie Cove, MD, 30 mg at 12/05/17 2345 .  gi cocktail (Maalox,Lidocaine,Donnatal), 30 mL, Oral, Once, Zannie Cove, MD .  Melene Muller ON 12/07/2017] Influenza vac split quadrivalent PF (FLUARIX) injection 0.5 mL, 0.5 mL, Intramuscular, Tomorrow-1000, Zannie Cove, MD .  iopamidol  (ISOVUE-300) 61 % injection, , , ,  .  metoprolol tartrate (LOPRESSOR) injection 5 mg, 5 mg, Intravenous, Q6H, Zannie Cove, MD, 5 mg at 12/06/17 1324 .  morphine 4 MG/ML injection 2 mg, 2 mg, Intravenous, Q3H PRN, Zannie Cove, MD .  ondansetron [facility]) tablet 4 mg, 4 mg, Oral, Q6H PRN **OR** ondansetron (ZOFRAN) injection 4 mg, 4 mg, Intravenous, Q6H PRN, Zannie Cove, MD .  pantoprazole (PROTONIX) EC tablet 40 mg, 40 mg, Oral, Q1200, Zannie Cove, MD, 40 mg at 12/06/17 1039 .  [START ON 12/07/2017] pneumococcal 23 valent vaccine (PNU-IMMUNE) injection 0.5 mL, 0.5 mL, Intramuscular, Tomorrow-1000, Zannie Cove, MD .  vancomycin (VANCOCIN) IVPB 1000 mg/200 mL premix, 1,000 mg, Intravenous, Q24H, Smitty Cords, [facility], Stopped at 12/06/17 1141   DISCUSSION: 27 y.o. Female presents with dyspnea, pleuritic chest pain.  Radiographic studies and clinical exam suggest a complicated pleural effusion.  ASSESSMENT / PLAN:  PULMONARY A: 1. CAP with parapneumonic effusion. P:   1. Needs surgical intervention to evacuate the left pleural space. Patient's prior surgery makes non surgical intervention unfeasible. Recommend surgical consultation. This was discussed with the patient, sister and father at the bedside.  INFECTIOUS A:   1. Pneumonia P:   1. Will need cultures from the fluid in order to direct antibiotic therapy. Currently agree with empiric maxipime/vancomycin.   Pulmonary and Critical Care Medicine [facility] Pager: [phone]  12/06/2017, 2:37 PM

## 2017-12-06 NOTE — Progress Notes (Deleted)
  Echocardiogram 2D Echocardiogram has been performed.  Olivia Charles, Laurent Cargile 12/06/2017, 2:57 PM

## 2017-12-06 NOTE — Progress Notes (Deleted)
  Echocardiogram 2D Echocardiogram has been performed.  Delcie RochENNINGTON, Wilhelm Ganaway 12/06/2017, 2:59 PM

## 2017-12-06 NOTE — Progress Notes (Signed)
  Echocardiogram 2D Echocardiogram has been performed.  Technically difficult study due to patient heart position and HR.  Rigel Filsinger L Androw 12/06/2017, 3:00 PM

## 2017-12-07 LAB — CBC
HEMATOCRIT: 27.4 % — AB (ref 36.0–46.0)
HEMOGLOBIN: 9.2 g/dL — AB (ref 12.0–15.0)
MCH: 29.9 pg (ref 26.0–34.0)
MCHC: 33.6 g/dL (ref 30.0–36.0)
MCV: 89 fL (ref 78.0–100.0)
Platelets: 236 10*3/uL (ref 150–400)
RBC: 3.08 MIL/uL — AB (ref 3.87–5.11)
RDW: 14.7 % (ref 11.5–15.5)
WBC: 21.3 10*3/uL — ABNORMAL HIGH (ref 4.0–10.5)

## 2017-12-07 LAB — LEGIONELLA PNEUMOPHILA SEROGP 1 UR AG: L. pneumophila Serogp 1 Ur Ag: NEGATIVE

## 2017-12-07 LAB — BASIC METABOLIC PANEL
Anion gap: 9 (ref 5–15)
BUN: <5 mg/dL — ABNORMAL LOW (ref 6–20)
CHLORIDE: 102 mmol/L (ref 101–111)
CO2: 23 mmol/L (ref 22–32)
Calcium: 8 mg/dL — ABNORMAL LOW (ref 8.9–10.3)
Creatinine, Ser: 0.76 mg/dL (ref 0.44–1.00)
GFR calc non Af Amer: >60 mL/min (ref >60)
Glucose, Bld: 106 mg/dL — ABNORMAL HIGH (ref 65–99)
POTASSIUM: 3.7 mmol/L (ref 3.5–5.1)
SODIUM: 134 mmol/L — AB (ref 135–145)

## 2017-12-07 MED ORDER — METOPROLOL TARTRATE 5 MG/5ML IV SOLN
5.0000 mg | Freq: Four times a day (QID) | INTRAVENOUS | Status: DC
Start: 2017-12-07 — End: 2017-12-08
  Administered 2017-12-07 (×2): 5 mg via INTRAVENOUS
  Filled 2017-12-07 (×2): qty 5

## 2017-12-07 MED ORDER — PROMETHAZINE HCL 25 MG/ML IJ SOLN
12.5000 mg | Freq: Four times a day (QID) | INTRAMUSCULAR | Status: DC | PRN
  Administered 2017-12-07: 12.5 mg via INTRAVENOUS
  Filled 2017-12-07: qty 1

## 2017-12-07 MED ORDER — DEXTROSE 5 % IV SOLN: 1.0000 g | Freq: Three times a day (TID) | INTRAVENOUS | Status: AC

## 2017-12-07 MED ORDER — VANCOMYCIN HCL IN DEXTROSE 1-5 GM/200ML-% IV SOLN: 1000.0000 mg | INTRAVENOUS | Status: AC

## 2017-12-07 MED ORDER — ALPRAZOLAM 0.5 MG PO TABS: 0.5000 mg | ORAL_TABLET | Freq: Three times a day (TID) | ORAL | Status: DC | PRN

## 2017-12-07 MED ORDER — SODIUM CHLORIDE 0.9 % IV SOLN
INTRAVENOUS | Status: DC
  Administered 2017-12-07: 11:00:00 via INTRAVENOUS

## 2017-12-07 NOTE — Progress Notes (Signed)
[facility] and [place] [facility].  Patient was alert and oriented, patient left with [place] at 1L with cell phone in her posession.  All other items were carried by family when patient discharged.  Patient was given 12.5 mg of phenergan before transport.  All necessary papers sent with patient.

## 2017-12-07 NOTE — Discharge Summary (Signed)
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   History of thoracolumbar scoliosis status post fusion and hardware   Discharge Condition: Guarded  Diet recommendation: Regular  Filed Weights   12/05/17 0354 12/05/17 2250  Weight: 42.6 kg (94 lb) 43.3 kg (95 lb 7.4 oz)    History of present illness:  Olivia Charles a 26 y.o.femalewith medical history significantfor thoracilumbar scoliosis status post fusion, history of esophageal reconstruction using colonic segment as an infant with no complications or problems following this,was admitted last Friday 2/1 with pleuritic chest pain/cough,treated for community-acquired pneumonia,clinically started improving and subsequently discharged home on Sunday 2/3on oral Abx.She reports clinically improving on antibiotic course in the hospital and did well for the first couple of days, subsequently on Wednesday onward started having left back pleuritic chest pain and cough which was minimally productive of yellow/brown sputum.In addition she also reported subjective sensations of hot and cold never checked her temperature,though she felt feverish. -She used a heating pad and NSAIDs with mild to moderate benefit however due to worsening of her back/chest pain presented to the emergency room again today where she was noted to be tachycardic,with increasing leukocytosis, mild lactic acidosis.CT angiogram of the chest was negative for pulmonary embolism showed small left pleural effusion and left lower lobe atelectasis.  Hospital Course:   1. Complex left pleural effusion/empyema -Initially felt to be small  parapneumonic effusion however over the next 24 hours she had worsening symptoms, persistent leukocytosis and repeat x-ray showed enlarging complex pleural effusion -Continued on broad-spectrum antibiotics with IV vancomycin and cefepime Day 2 -Status post thoracentesis 2/8 with 500 mL, thin brownish fluid drained -On pleural fluid analysis glucose is 106, LDH is 370, protein is less than 3. Pleural fluid WBC count is 15,3000 with 80% neutrophils -This is felt to be consistent with exudative pleural effusion, most likely empyema -She was seen by pulmonary yesterday evening and recommended for VATS/decortication -Case discussed with thoracic surgery Dr. Tyrone Sage, he recommended transfer to tertiary center due to abnormal anatomy with esophageal reconstruction -She will be transferred to a tertiary center for VATS/decortication, case was discussed with Dr.Wudel at [facility] who has graciously agreed to accept this patient -Pleural fluid culture 1 day is negative --HIV negative, Urine Legionella and pneumo Ag negative  2. H/o Esophageal reconstruction as an infant using Colon Segment -CTA chest and Barium esophagram without leak or obstruction -no symptoms of dysphagia or odynophagia  Procedures:  Ultrasound-guided thoracentesis 2/8 at 4:50 PM - of brown thin fluid drained  Consultations:  Pulmonary  D/w CVTS  Discharge Exam: Vitals:   12/06/17 2203 12/07/17 0532  BP: (!) 96/58 (!) 100/55  Pulse: (!) 123 (!) 132  Resp: 18 16  Temp: 98.4 F (36.9 C) 98.6 F (37 C)  SpO2: 95% 99%    General: AAOx3 Cardiovascular: SS2/RRR, tachycardic Respiratory: decreased BS at Left base  Discharge Instructions   Discharge Instructions    Diet - low sodium heart healthy   Complete by:  As directed    Increase activity slowly   Complete by:  As directed      Allergies as of 12/07/2017   No Known Allergies     Medication  List    STOP taking these medications   azithromycin  500 MG tablet Commonly known as:  ZITHROMAX   famotidine 20 MG tablet Commonly known as:  PEPCID   metroNIDAZOLE 500 MG tablet Commonly known as:  FLAGYL     TAKE these medications   acetaminophen 500 MG tablet Commonly known as:  TYLENOL Take 1,000 mg by mouth every 6 (six) hours as needed.   ceFEPIme 1 g in dextrose 5 % 50 mL Inject 1 g into the vein every 8 (eight) hours.   guaiFENesin 600 MG 12 hr tablet Commonly known as:  MUCINEX Take 1 tablet (600 mg total) by mouth 2 (two) times daily for 7 days.   omeprazole 20 MG capsule Commonly known as:  PRILOSEC Take 20 mg by mouth daily.   ondansetron 4 MG tablet Commonly known as:  ZOFRAN Take 1 tablet (4 mg total) by mouth every 6 (six) hours as needed for nausea.   valACYclovir 500 MG tablet Commonly known as:  VALTREX Take 500 mg by mouth daily.   vancomycin 1-5 GM/200ML-% Soln Commonly known as:  VANCOCIN Inject 200 mLs (1,000 mg total) into the vein daily. Start taking on:  12/08/2017   VISINE OP Place 1 drop into both eyes daily as needed (irritation).      No Known Allergies    The results of significant diagnostics from this hospitalization (including imaging, microbiology, ancillary and laboratory) are listed below for reference.    Significant Diagnostic Studies: Dg Chest 1 View  Result Date: 12/06/2017 CLINICAL DATA:  Post LEFT thoracentesis EXAM: CHEST 1 VIEW COMPARISON:  Exam at 1645 hours compared to 1015 hours FINDINGS: Decreased LEFT pleural effusion post thoracentesis. No definite pneumothorax. Patient has known LEFT diaphragmatic hernia with colon herniation into the inferior LEFT chest, less well visualized on current exam. Residual atelectasis and effusion in LEFT chest. Question small loculated RIGHT pleural effusion at lateral RIGHT lung base. Remaining RIGHT lung clear. Stable heart size. Significant dextroconvex thoracolumbar scoliosis with note of Harrington rods. Small loculated gas  collections at the inferior LEFT cervical region as noted on recent CT. IMPRESSION: Decreased LEFT pleural effusion and atelectasis post thoracentesis without pneumothorax. Persistent small loculated gas collections at the inferior LEFT cervical region. Known LEFT diaphragmatic hernia and colonic extension into the LEFT hemithorax. Electronically Signed   By: Ulyses Southward M.D.   On: 12/06/2017 16:58   Dg Chest 2 View  Result Date: 12/06/2017 CLINICAL DATA:  Pleural effusion. EXAM: CHEST  2 VIEW COMPARISON:  Radiographs of December 05, 2017. FINDINGS: There is interval development of moderate left pleural effusion. No pneumothorax is noted. Minimal right basilar subsegmental atelectasis is noted. Continued presence of bowel seen projected over the left hemithorax. Harrington rods are seen involving the thoracic spine and lumbar spine. Stable dextroscoliosis of thoracic spine is noted. IMPRESSION: Interval development of moderate left pleural effusion, with continued presence of bowel projected over the left hemithorax. Electronically Signed   By: Lupita Raider, M.D.   On: 12/06/2017 10:28   Dg Chest 2 View  Result Date: 12/05/2017 CLINICAL DATA:  Acute onset of central chest pain and mild shortness of breath. Upper back pain. EXAM: CHEST  2 VIEW COMPARISON:  CTA of the chest performed 11/29/2017 FINDINGS: As previously noted, the patient is status post esophageal reconstruction using the colon. The colonic loop is largely filled with air, extending over the left hemithorax. No superimposed focal airspace consolidation is seen. No pleural effusion or pneumothorax  is identified. The heart is normal in size. Thoracolumbar spinal fusion hardware is partially imaged. IMPRESSION: No acute cardiopulmonary process seen. Status post esophageal reconstruction using the colon. Electronically Signed   By: Roanna Raider M.D.   On: 12/05/2017 04:45   Dg Chest 2 View  Result Date: 11/29/2017 CLINICAL DATA:  Chest pain  with onset 1 week ago. EXAM: CHEST  2 VIEW COMPARISON:  01/29/2012 FINDINGS: Cardiomediastinal silhouette is normal. Mediastinal contours appear intact. There is no evidence of pleural effusion or pneumothorax. Airspace consolidation in the inferior aspect of the left upper lobe/left middle lobe. Gas soft tissue levels seen on the lateral view. Osseous structures are without acute abnormality. Prior posterior spinal fusion. Soft tissues are grossly normal. IMPRESSION: Airspace consolidation in the mid left thorax with gas soft tissue levels seen on the lateral view. This may represent cavitating pneumonia or other space-occupying lesion. Further evaluation with chest CT with contrast may be considered. Electronically Signed   By: Ted Mcalpine M.D.   On: 11/29/2017 17:38   Ct Angio Chest Pe W And/or Wo Contrast  Result Date: 12/05/2017 CLINICAL DATA:  Chest pain, shortness of breath. EXAM: CT ANGIOGRAPHY CHEST WITH CONTRAST TECHNIQUE: Multidetector CT imaging of the chest was performed using the standard protocol during bolus administration of intravenous contrast. Multiplanar CT image reconstructions and MIPs were obtained to evaluate the vascular anatomy. CONTRAST:  ISOVUE-370 IOPAMIDOL (ISOVUE-370) INJECTION 76% COMPARISON:  Radiographs of same day.  CT scan of November 29, 2017. FINDINGS: Cardiovascular: Satisfactory opacification of the pulmonary arteries to the segmental level. No evidence of pulmonary embolism. Normal heart size. No pericardial effusion. There is no evidence of thoracic aortic dissection or aneurysm. Mediastinum/Nodes: There is continued presence of anterior left diaphragmatic hernia with large loop of colon extending to the left lung apex. Air collection is again noted anterior to the sternum which may be related to esophageal reconstruction. Thyroid gland is unremarkable. No definite mediastinal adenopathy is noted. Lungs/Pleura: No pneumothorax is noted. Right lung is clear.  Mild left pleural effusion is noted with left lower lobe atelectasis present. Upper Abdomen: Continued presence of left diaphragmatic hernia as described above. Musculoskeletal: Status post surgical fusion of thoracic and visualized lumbar spine. No acute abnormality is noted. Review of the MIP images confirms the above findings. IMPRESSION: No definite evidence of pulmonary embolus. Continued presence of anterior left diaphragmatic hernia with large loop of colon is seen extending as superiorly as left lung apex. Air collection is noted anterior to the sternum which may be related to esophageal reconstruction. Mild left pleural effusion is noted with left lower lobe atelectasis. Electronically Signed   By: Lupita Raider, M.D.   On: 12/05/2017 12:41   Ct Angio Chest Pe W And/or Wo Contrast  Result Date: 11/29/2017 CLINICAL DATA:  Chest pain, shortness of Breath. Remote history of esophageal reconstruction. EXAM: CT ANGIOGRAPHY CHEST WITH CONTRAST TECHNIQUE: Multidetector CT imaging of the chest was performed using the standard protocol during bolus administration of intravenous contrast. Multiplanar CT image reconstructions and MIPs were obtained to evaluate the vascular anatomy. CONTRAST:  ISOVUE-370 IOPAMIDOL (ISOVUE-370) INJECTION 76% COMPARISON:  None. FINDINGS: Cardiovascular: Heart is normal size. Aorta is normal caliber. No filling defects in the pulmonary arteries to suggest pulmonary emboli. Mediastinum/Nodes: No adenopathy. The esophagus abnormal appearing. The fluid-filled. Possible diverticulum off the distal esophagus, measuring 1.9 cm on image 87. Esophagus is difficult to follow in the upper thoracic region. Lungs/Pleura: Gas collection noted in the anterior  left hemithorax. On the coronal views, this appears to represent colon passing through a very small anterior diaphragmatic defect seen best on sagittal image 83. The air-fluid level seen on chest x-ray were in this herniated colon  segment. When comparing to prior chest x-ray 01/29/2012, there appear to be colon within the chest on that study this was difficult to visualize. This presumably represents the esophageal reconstruction reported by the patient. Areas of nodular ground-glass consolidation throughout the left lower lobe compatible with pneumonia or aspiration pneumonia. Right lung is clear. No effusions. Upper Abdomen: Although difficult to see, anterior diaphragmatic noted noted on the left at approximately image 107 of the axial images. No acute findings in the upper abdomen. Musculoskeletal: There is gas tracking from the colon within the upper left hemithorax which tracks superiorly anterior to the left innominate vein and tracks into the left side of the neck. This presumably is part of the esophageal reconstruction with the colon which likely ties into the left side of the cervical esophagus. Severe thoracolumbar scoliosis. Posterior spinal rods noted in the thoracic and visualized lumbar spine. Review of the MIP images confirms the above findings. IMPRESSION: The air-fluid level seen on today's chest x-ray appear to be within colon in the left anterior hemithorax. Gas and stool filled loop of colon extend from the anterior left hemidiaphragm superiorly to the left apex. In retrospect, this herniated colon was likely present in the left chest on remote chest x-ray from 01/29/2012. In addition, gas extends from the anterior left hemithorax and the herniated colon superiorly in the left side of the neck. The these findings presumably represent the esophageal reconstruction with a colon segment and layering food material dependently in the dilated colon. Abnormal appearance of the esophagus which appears thick walled in some areas and is fluid-filled and dilated throughout its course in the chest. Distally, there is fluid adjacent to the distal esophagus measuring 1.9 cm which may reflect a distal esophageal diverticulum.  Consolidation noted in the left lower lobe compatible with pneumonia. Cannot exclude aspiration pneumonia. These results were called by telephone at the time of interpretation on 11/29/2017 at 11:12 pm to Dr. Lynden Oxford , who verbally acknowledged these results. Electronically Signed   By: Charlett Nose M.D.   On: 11/29/2017 23:12   Ct Abdomen Pelvis W Contrast  Result Date: 12/05/2017 CLINICAL DATA:  Abdominal distention. EXAM: CT ABDOMEN AND PELVIS WITH CONTRAST TECHNIQUE: Multidetector CT imaging of the abdomen and pelvis was performed using the standard protocol following bolus administration of intravenous contrast. CONTRAST:  ISOVUE-300 IOPAMIDOL (ISOVUE-300) INJECTION 61% COMPARISON:  None. FINDINGS: Lower chest: Unchanged moderate left pleural effusion left lower lobe atelectasis. Unchanged esophageal reconstruction using a loop of colon within the left hemithorax. Hepatobiliary: No focal liver abnormality is seen. No gallstones, gallbladder wall thickening, or biliary dilatation. Pancreas: Unremarkable. No pancreatic ductal dilatation or surrounding inflammatory changes. Spleen: Normal in size without focal abnormality. Adrenals/Urinary Tract: Streak artifact from lumbar hardware limits evaluation. The adrenal glands and kidneys are grossly unremarkable. Excreted contrast with the renal collecting systems and bladder. No hydronephrosis. Stomach/Bowel: No bowel wall thickening, distention, or surrounding inflammatory changes. Vascular/Lymphatic: No significant vascular findings are present. No enlarged abdominal or pelvic lymph nodes. Reproductive: Uterus and bilateral adnexa are unremarkable. Other: No free fluid or pneumoperitoneum. Musculoskeletal: Lower lumbar scoliosis status post Harrington rod placement and lumbar fusion. No acute osseous abnormality IMPRESSION: 1.  No acute intra-abdominal process. 2. Postsurgical changes related to esophageal reconstruction, using a  loop of colon  within the left hemithorax. Electronically Signed   By: Obie DredgeWilliam T Derry M.D.   On: 12/05/2017 16:35   Dg Esophagus  Result Date: 12/06/2017 CLINICAL DATA:  History of esophageal reconstruction with a segment of colon. Chest pain and neck pain. Gas collection within the left side of the neck. Rule out leak. EXAM: ESOPHOGRAM/BARIUM SWALLOW TECHNIQUE: Single contrast examination was performed using water-soluble contrast. FLUOROSCOPY TIME:  Fluoroscopy Time:  2 min 6 sec Radiation Exposure Index (if provided by the fluoroscopic device): 18.3 mGy Number of Acquired Spot Images: 0 COMPARISON:  CT 12/05/2017 FINDINGS: Water-soluble swallow was performed. This shows filling of a large dilated sac-like pouch in the left side of the neck. This communicates freely with the lumen above and below. There is stasis of contrast within this sac-like structure. When the patient squeeze is this area with her hand, contrast flows into the lumen. No visible leak or obstruction. 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 There is stasis of contrast within this pouch. No evidence of leak. Electronically Signed   By: Charlett NoseKevin  Dover M.D.   On: 12/06/2017 08:33   Ir Thoracentesis Asp Pleural Space W/img Guide  Result Date: 12/06/2017 INDICATION: History of pneumonia with a left pleural effusion. Request made for diagnostic and therapeutic thoracentesis. EXAM: ULTRASOUND GUIDED DIAGNOSTIC AND THERAPEUTIC THORACENTESIS MEDICATIONS: 1% lidocaine COMPLICATIONS: None immediate. PROCEDURE: An ultrasound guided thoracentesis was thoroughly discussed with the patient and questions answered. The benefits, risks, alternatives and complications were also discussed. The patient understands and wishes to proceed with the procedure. Written consent was obtained. Ultrasound was performed to localize and mark an adequate pocket of  fluid in the left chest. The area was then prepped and draped in the normal sterile fashion. 1% Lidocaine was used for local anesthesia. Under ultrasound guidance a Safe-T-Centesis catheter was introduced. Thoracentesis was performed. The catheter was removed and a dressing applied. FINDINGS: A total of approximately 0.55 L of thin, brown fluid was removed. Samples were sent to the laboratory as requested by the clinical team. IMPRESSION: Successful ultrasound guided left thoracentesis yielding 0.55 L of pleural fluid. Read by: Barnetta ChapelKelly Osborne, PA-C Electronically Signed   By: Malachy MoanHeath  McCullough M.D.   On: 12/06/2017 16:56    Microbiology: Recent Results (from the past 240 hour(s))  Blood culture (routine x 2)     Status: None   Collection Time: 11/29/17 10:00 PM  Result Value Ref Range Status   Specimen Description BLOOD LEFT ANTECUBITAL  Final   Special Requests   Final    BOTTLES DRAWN AEROBIC AND ANAEROBIC Blood Culture adequate volume   Culture   Final    NO GROWTH 5 DAYS Performed at [facility], [address] [place], [place] [address]    Report Status 12/04/2017 FINAL  Final  Blood culture (routine x 2)     Status: None   Collection Time: 11/29/17 10:10 PM  Result Value Ref Range Status   Specimen Description BLOOD RIGHT ANTECUBITAL  Final   Special Requests   Final    BOTTLES DRAWN AEROBIC AND ANAEROBIC Blood Culture adequate volume   Culture   Final    NO GROWTH 5 DAYS Performed at [facility], [address] [place], [place] [address]    Report Status 12/04/2017 FINAL  Final  Culture, expectorated sputum-assessment     Status: None  Collection Time: 12/05/17 11:01 PM  Result Value Ref Range Status   Specimen Description EXPECTORATED SPUTUM  Final   Special Requests   Final    Normal Performed at [facility], [address], [place], [place] [address]    Sputum evaluation THIS SPECIMEN IS ACCEPTABLE FOR SPUTUM CULTURE  Final   Report Status  12/06/2017 FINAL  Final  Culture, respiratory (NON-Expectorated)     Status: None (Preliminary result)   Collection Time: 12/05/17 11:01 PM  Result Value Ref Range Status   Specimen Description EXPECTORATED SPUTUM  Final   Special Requests Normal Reflexed from [id number]  Final   Gram Stain   Final    RARE WBC PRESENT, PREDOMINANTLY PMN RARE SQUAMOUS EPITHELIAL CELLS PRESENT RARE GRAM POSITIVE COCCI    Culture   Final    CULTURE REINCUBATED FOR BETTER GROWTH Performed at [facility], [address] [place], [place] [address]    Report Status PENDING  Incomplete  Body fluid culture     Status: None (Preliminary result)   Collection Time: 12/06/17  4:39 PM  Result Value Ref Range Status   Specimen Description FLUID LEFT PLEURAL  Final   Special Requests NONE  Final   Gram Stain   Final    ABUNDANT WBC PRESENT, PREDOMINANTLY PMN NO ORGANISMS SEEN    Culture   Final    NO GROWTH 1 DAY Performed at [facility], [address] [place], [place] [address]    Report Status PENDING  Incomplete     Labs: Basic Metabolic Panel: Recent Labs  Lab 12/01/17 0558 12/05/17 0401 12/05/17 2255 12/06/17 0513 12/07/17 0227  NA 132* 134*  --  134* 134*  K 4.0 3.6  --  3.7 3.7  CL 98* 97*  --  103 102  CO2 23 21*  --  21* 23  GLUCOSE 112* 141*  --  131* 106*  BUN 9 11  --  <5* <5*  CREATININE 0.59 0.83 0.66 0.57 0.76  CALCIUM 8.9 9.0  --  7.9* 8.0*   Liver Function Tests: Recent Labs  Lab 12/05/17 1400 12/06/17 0513  AST 30 15  ALT 31 19  ALKPHOS 48 35*  BILITOT 0.6 0.6  PROT 6.9 5.0*  4.9*  ALBUMIN 4.2 2.9*   Recent Labs  Lab 12/05/17 1400  LIPASE 37   No results for input(s): AMMONIA in the last 168 hours. CBC: Recent Labs  Lab 12/01/17 0558 12/05/17 0401 12/05/17 2255 12/06/17 0513 12/07/17 0227  WBC 12.7* 19.1* 19.7* 18.6* 21.3*  NEUTROABS 10.2*  --   --   --   --   HGB 12.3 11.9* 10.2* 10.0* 9.2*  HCT 35.9* 35.4* 30.1* 29.0* 27.4*  MCV 87.1  87.0 87.8 88.4 89.0  PLT 265 258 269 204 236   Cardiac Enzymes: No results for input(s): CKTOTAL, CKMB, CKMBINDEX, TROPONINI in the last 168 hours. BNP: BNP (last 3 results) No results for input(s): BNP in the last 8760 hours.  ProBNP (last 3 results) No results for input(s): PROBNP in the last 8760 hours.  CBG: No results for input(s): GLUCAP in the last 168 hours.     Signed:  Zannie Cove MD.  [facility] 12/07/2017, 1:10 PM

## 2017-12-08 LAB — PH, BODY FLUID: PH, BODY FLUID: 6.9

## 2017-12-08 LAB — CULTURE, RESPIRATORY: CULTURE: NORMAL

## 2017-12-08 LAB — CULTURE, RESPIRATORY W GRAM STAIN: Special Requests: NORMAL

## 2017-12-10 LAB — BODY FLUID CULTURE: CULTURE: NO GROWTH

## 2017-12-11 LAB — PATHOLOGIST SMEAR REVIEW

## 2019-01-11 IMAGING — CT CT ABD-PELV W/ CM
2 of 4 series · 16 of 46 positions shown, 18 images · IV contrast (APPLIED)
Comparison: None.

CLINICAL DATA: Abdominal distention.

EXAM:
CT ABDOMEN AND PELVIS WITH CONTRAST
TECHNIQUE: Multidetector CT imaging of the abdomen and pelvis was performed
using the standard protocol following bolus administration of
intravenous contrast.
CONTRAST:  100mL NZ5KRG-GPP IOPAMIDOL (NZ5KRG-GPP) INJECTION 61%

[Series 3: abd/ pelvis 5.0 i30f 2 · axial · 0.60mm/px · z∈[+934,+1289]mm · 13 of 79 slices shown, 15 images]
[im 4/79  soft-tissue]
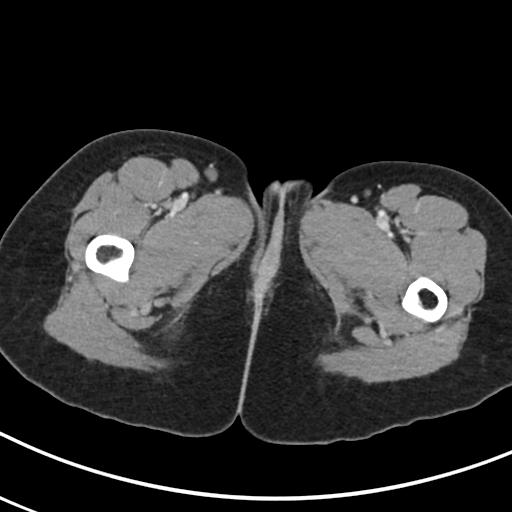
[im 4/79  bone]
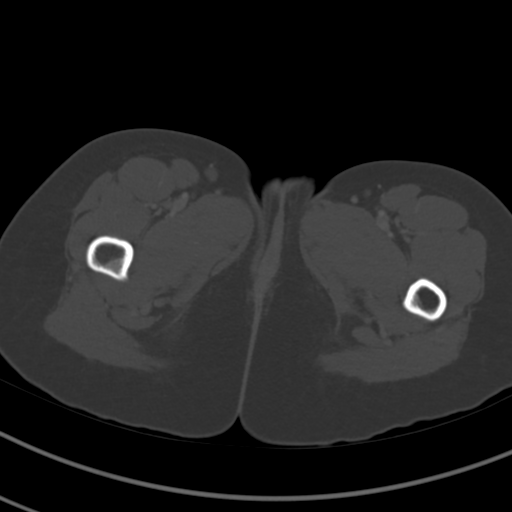
[im 10/79  soft-tissue]
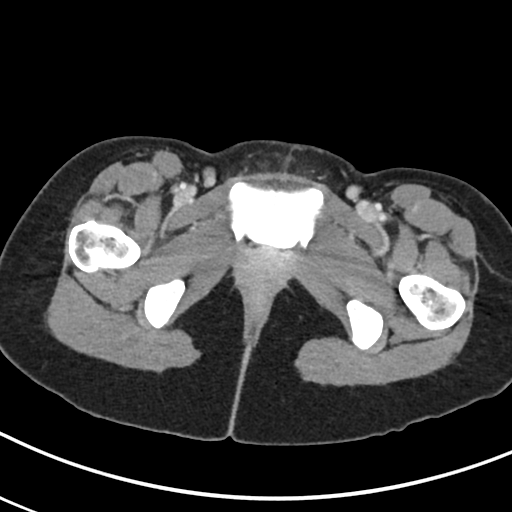
[im 16/79  soft-tissue]
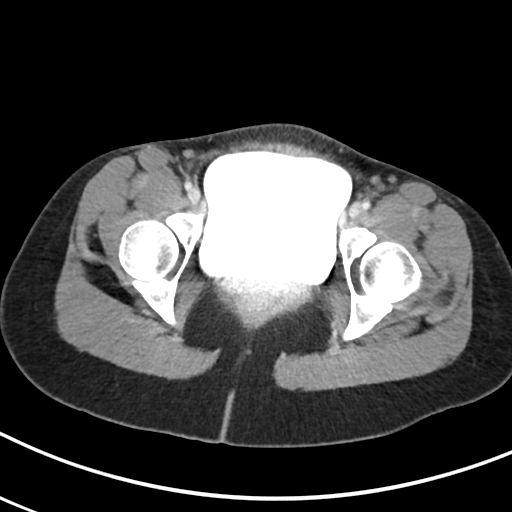
[im 22/79  soft-tissue]
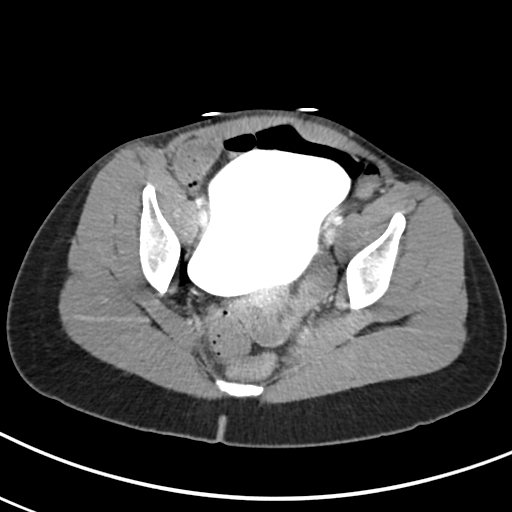
[im 29/79  soft-tissue]
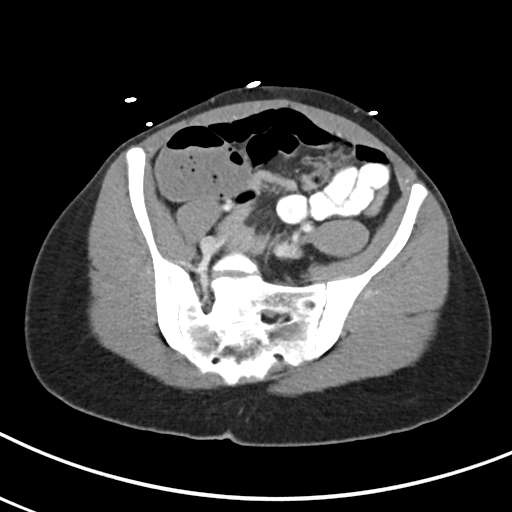
[im 35/79  soft-tissue]
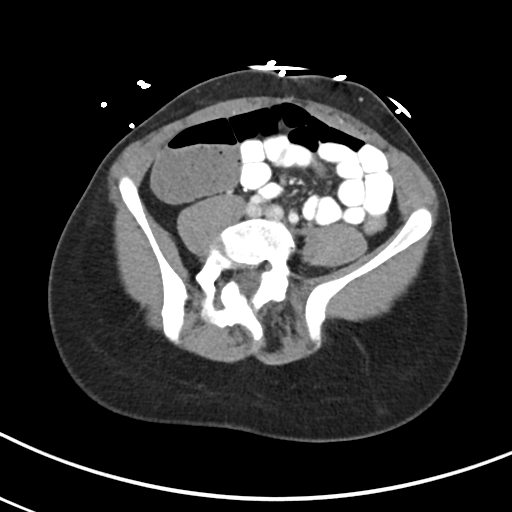
[im 41/79  soft-tissue]
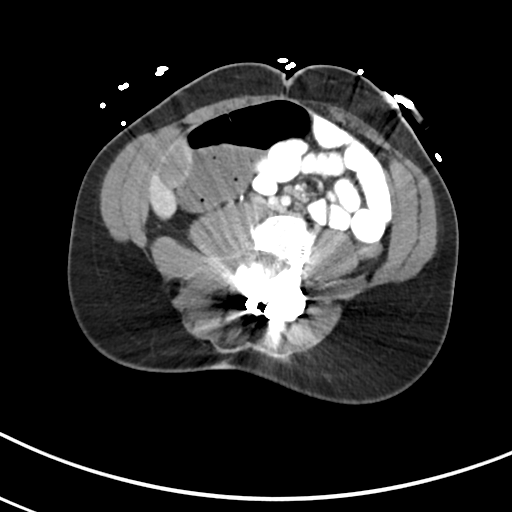
[im 44/79  soft-tissue]
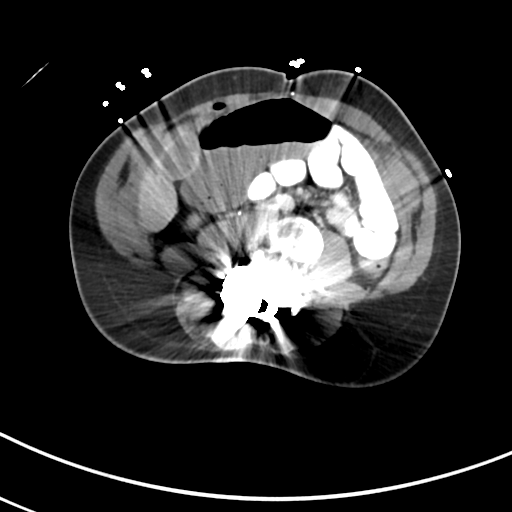
[im 50/79  soft-tissue]
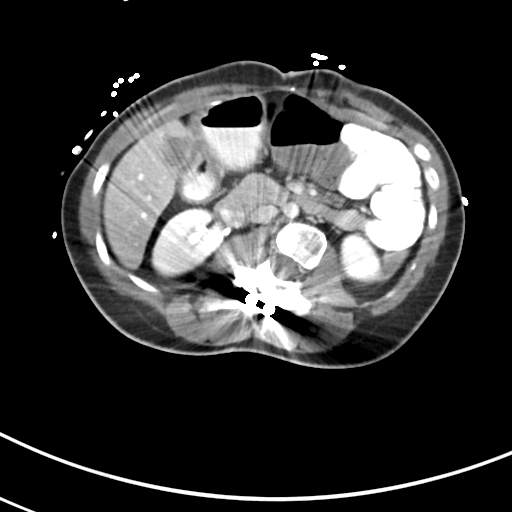
[im 50/79  bone]
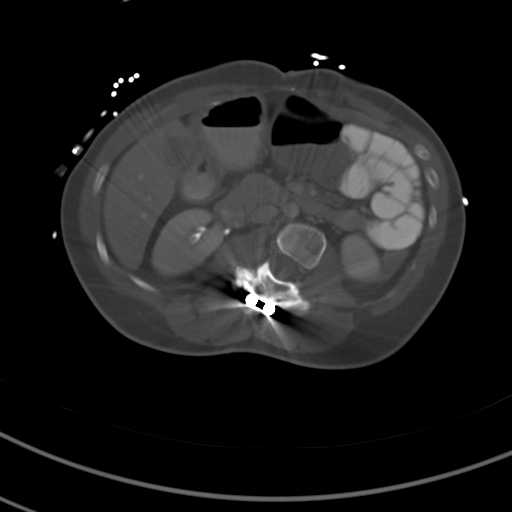
[im 57/79  soft-tissue]
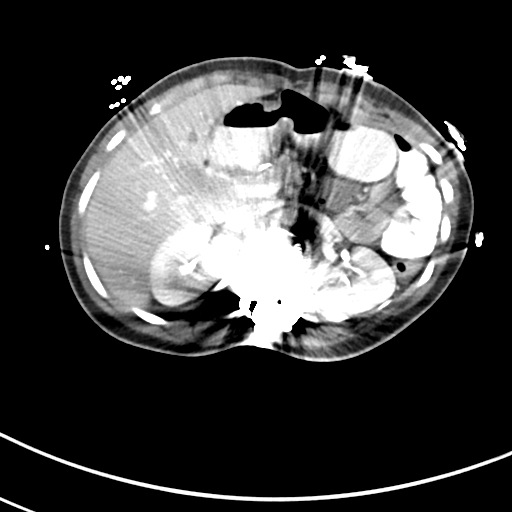
[im 63/79  soft-tissue]
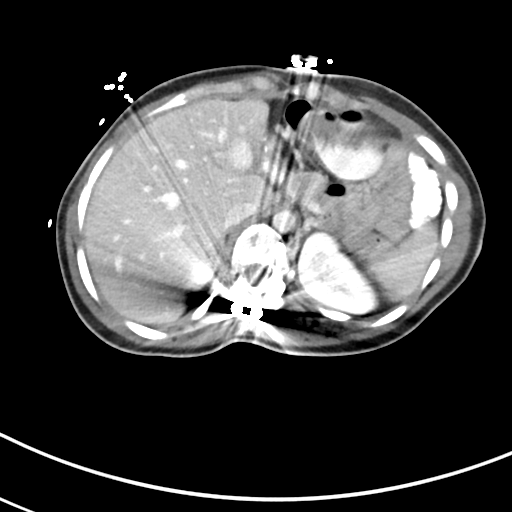
[im 69/79  soft-tissue]
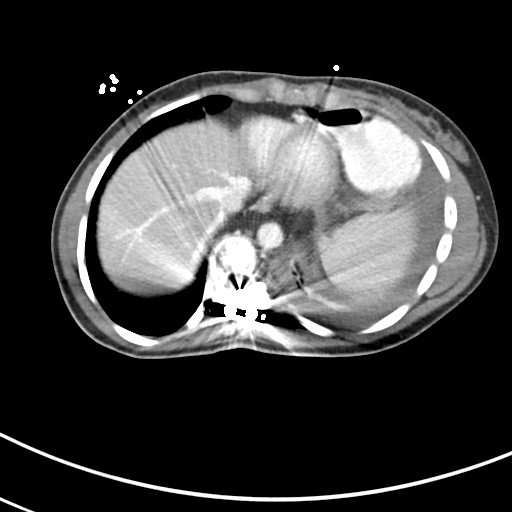
[im 75/79  soft-tissue]
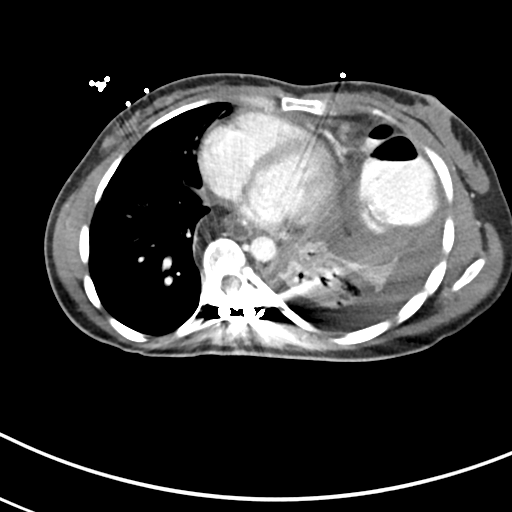

[Series 6: coronal soft tissue · coronal · 0.62mm/px · 3 of 84 slices shown]
[im 28/84  soft-tissue]
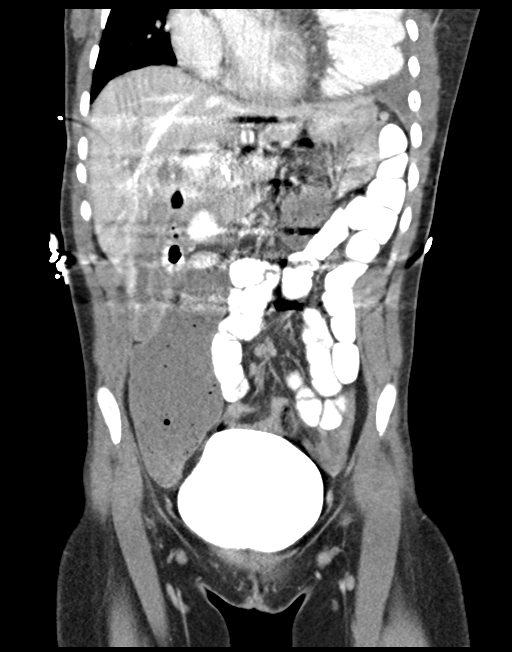
[im 37/84  soft-tissue]
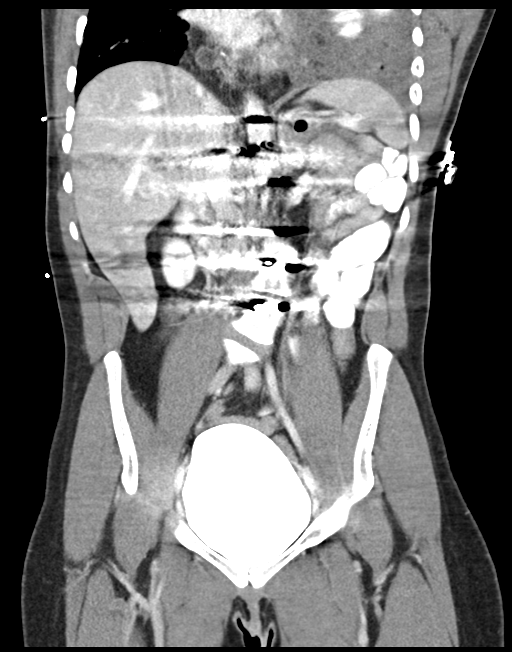
[im 47/84  soft-tissue]
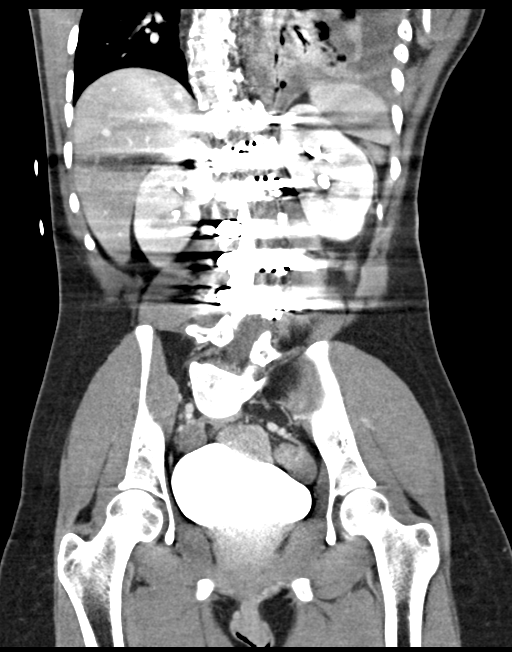

[16 of 46 positions shown; findings below may reference images not displayed]

FINDINGS: Lower chest: Unchanged moderate left pleural effusion left lower
lobe atelectasis. Unchanged esophageal reconstruction using a loop
of colon within the left hemithorax.

Hepatobiliary: No focal liver abnormality is seen. No gallstones,
gallbladder wall thickening, or biliary dilatation.

Pancreas: Unremarkable. No pancreatic ductal dilatation or
surrounding inflammatory changes.

Spleen: Normal in size without focal abnormality.

Adrenals/Urinary Tract: Streak artifact from lumbar hardware limits
evaluation. The adrenal glands and kidneys are grossly unremarkable.
Excreted contrast with the renal collecting systems and bladder. No
hydronephrosis.

Stomach/Bowel: No bowel wall thickening, distention, or surrounding
inflammatory changes.

Vascular/Lymphatic: No significant vascular findings are present. No
enlarged abdominal or pelvic lymph nodes.

Reproductive: Uterus and bilateral adnexa are unremarkable.

Other: No free fluid or pneumoperitoneum.

Musculoskeletal: Lower lumbar scoliosis status post Harrington rod
placement and lumbar fusion. No acute osseous abnormality
IMPRESSION: 1.  No acute intra-abdominal process.
2. Postsurgical changes related to esophageal reconstruction, using
a loop of colon within the left hemithorax.

## 2019-01-12 IMAGING — CR DG CHEST 1V
1 series · 1 of 1 positions shown · non-contrast
Comparison: Exam at 5718 hours compared to 5858 hours

CLINICAL DATA: Post LEFT thoracentesis

EXAM:
CHEST 1 VIEW

[chest ap]
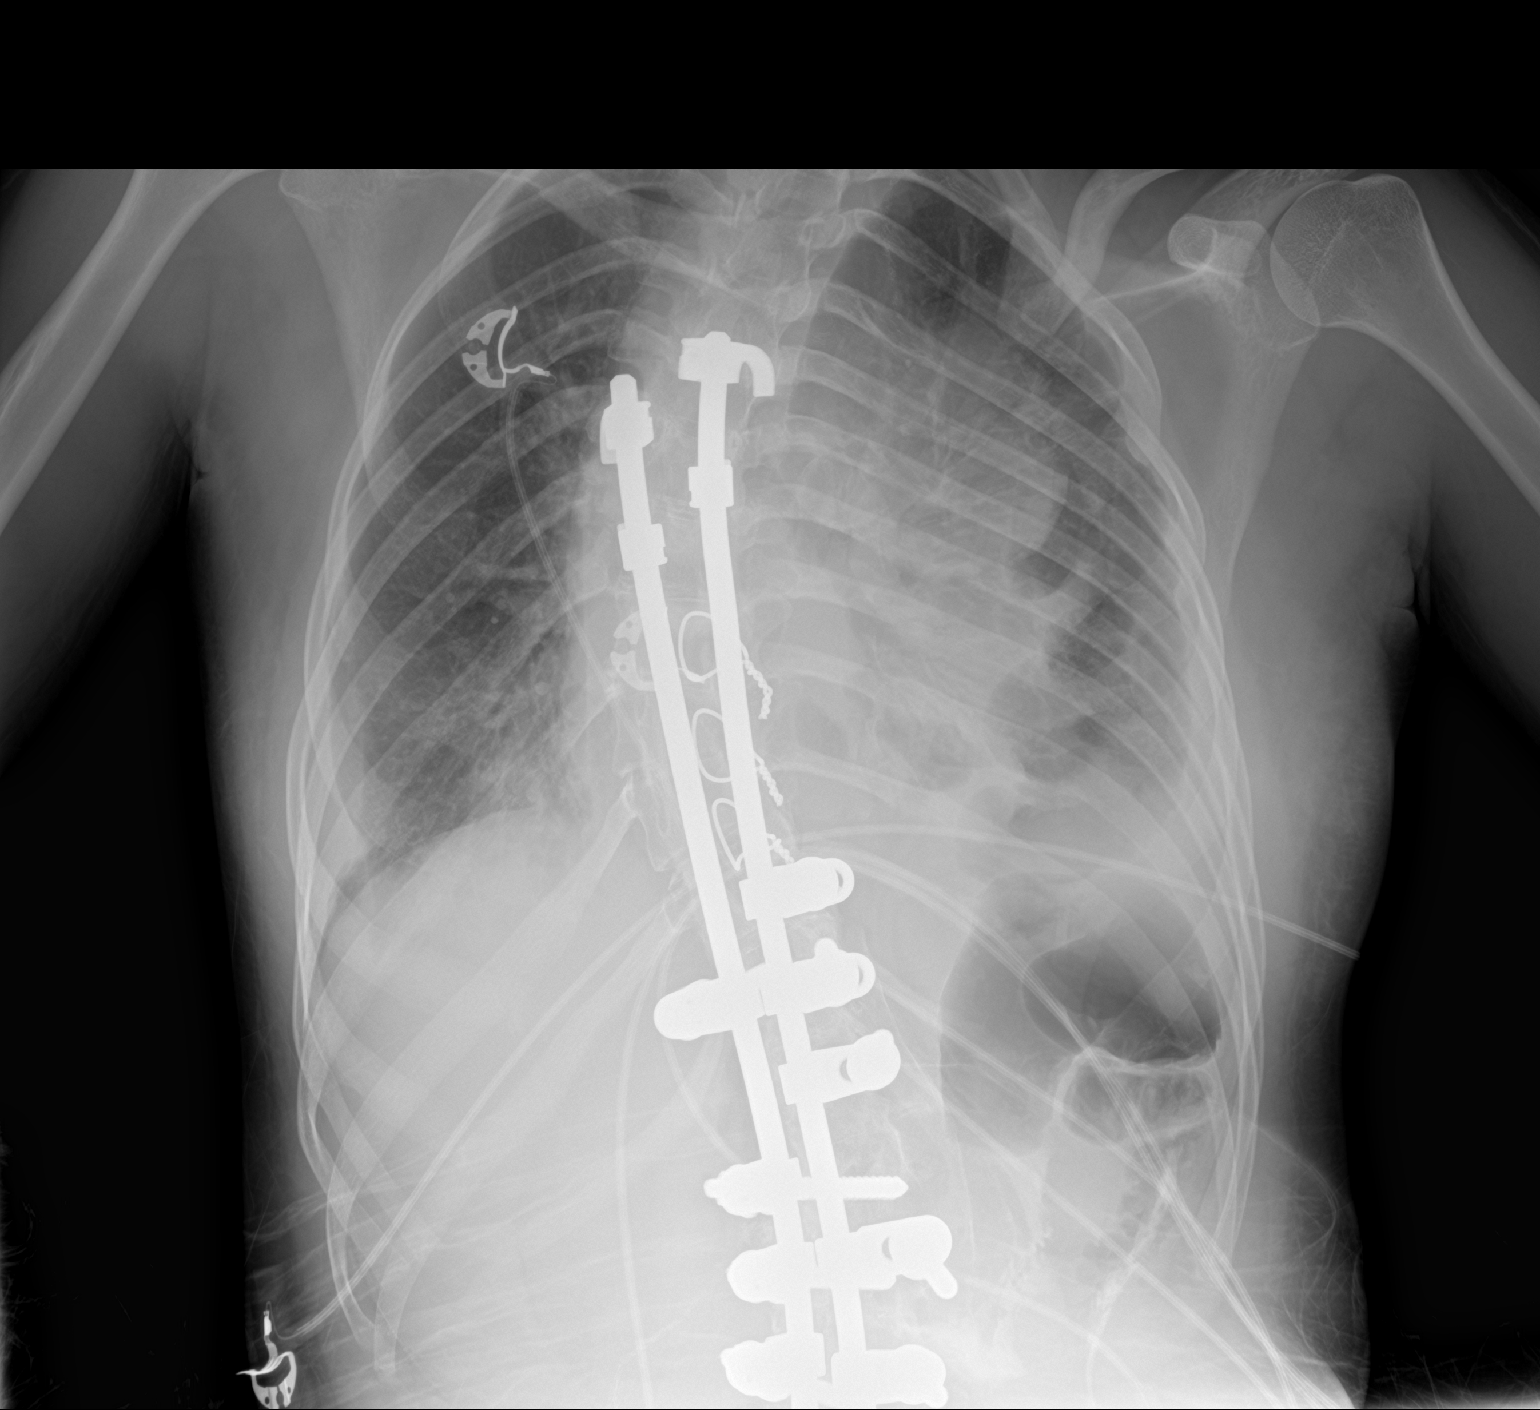

[1 of 1 positions shown; findings below may reference images not displayed]

FINDINGS: Decreased LEFT pleural effusion post thoracentesis.

No definite pneumothorax.

Patient has known LEFT diaphragmatic hernia with colon herniation
into the inferior LEFT chest, less well visualized on current exam.

Residual atelectasis and effusion in LEFT chest.

Question small loculated RIGHT pleural effusion at lateral RIGHT
lung base.

Remaining RIGHT lung clear.

Stable heart size.

Significant dextroconvex thoracolumbar scoliosis with note of
Harrington rods.

Small loculated gas collections at the inferior LEFT cervical region
as noted on recent CT.
IMPRESSION: Decreased LEFT pleural effusion and atelectasis post thoracentesis
without pneumothorax.

Persistent small loculated gas collections at the inferior LEFT
cervical region.

Known LEFT diaphragmatic hernia and colonic extension into the LEFT
hemithorax.

## 2019-01-12 IMAGING — US IR THORACENTESIS ASP PLEURAL SPACE W/IMG GUIDE
1 series · 3 of 3 positions shown · non-contrast
Comparison: none

INDICATION: History of pneumonia with a left pleural effusion. Request made for
diagnostic and therapeutic thoracentesis.

[Series 1: ir (id) (id)/(id)/(id) ir · 3 of 3 slices shown]
[im 1/3]
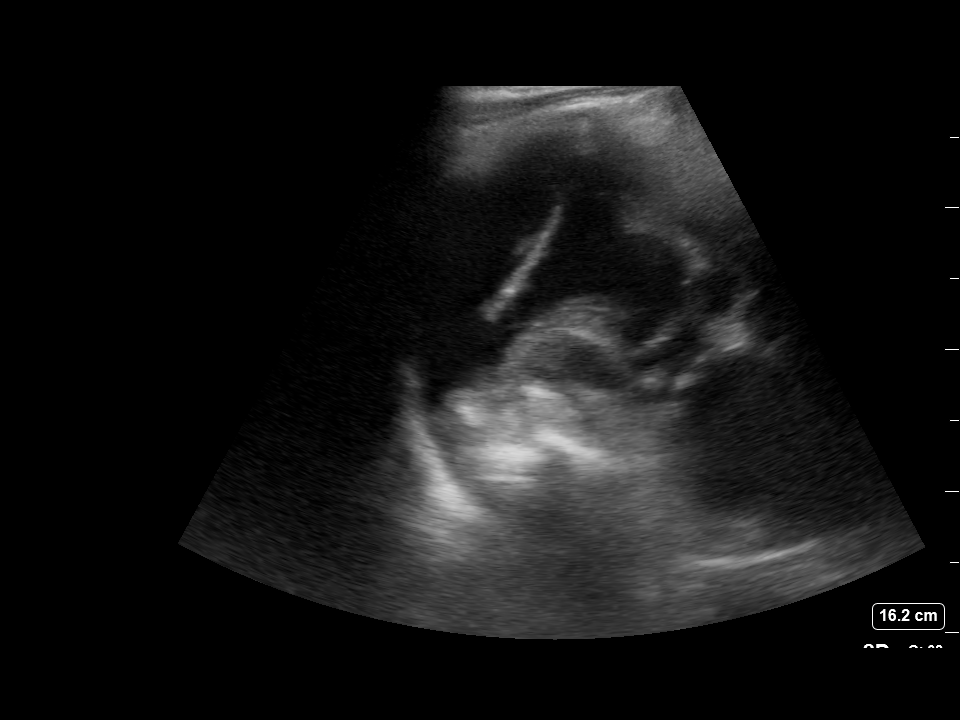
[im 2/3]
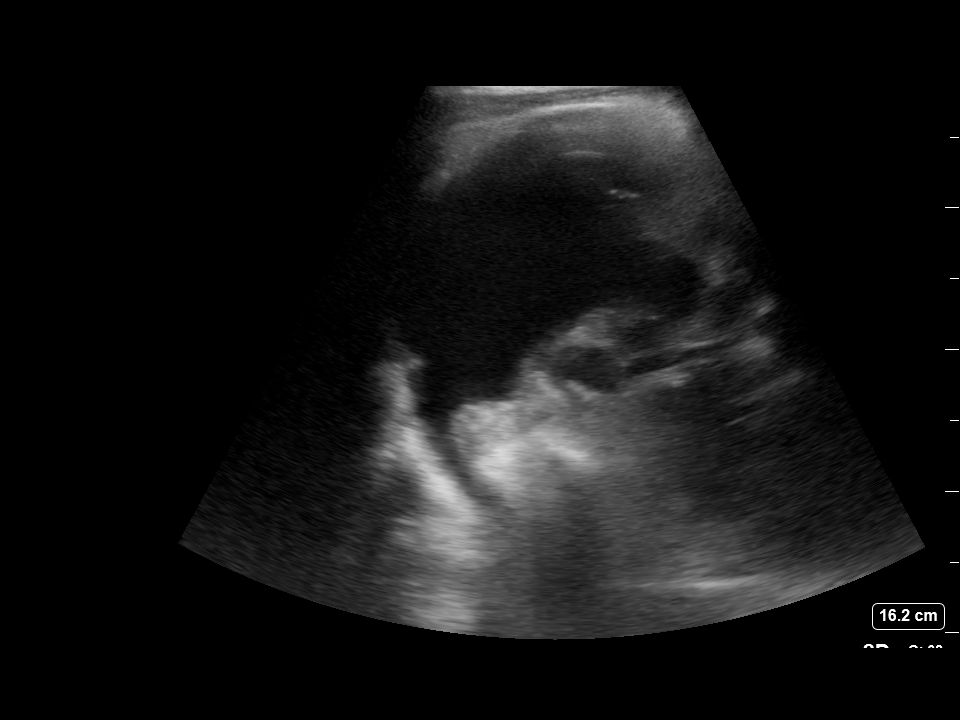
[im 3/3]
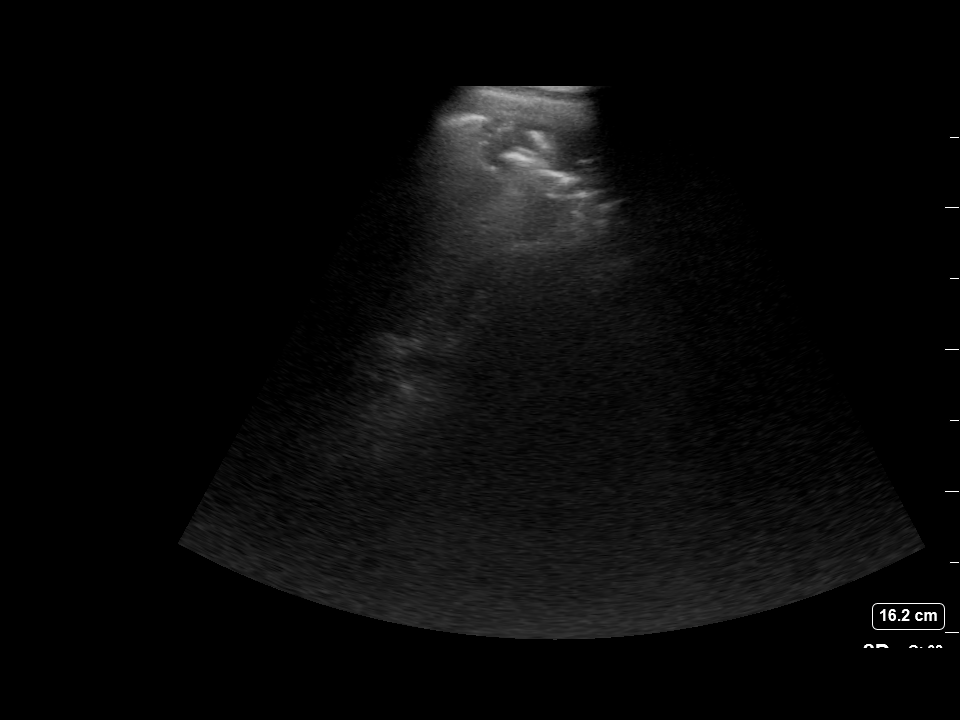

[3 of 3 positions shown; findings below may reference images not displayed]

EXAM:
ULTRASOUND GUIDED DIAGNOSTIC AND THERAPEUTIC THORACENTESIS

MEDICATIONS:
1% lidocaine

COMPLICATIONS:
None immediate.

PROCEDURE:
An ultrasound guided thoracentesis was thoroughly discussed with the
patient and questions answered. The benefits, risks, alternatives
and complications were also discussed. The patient understands and
wishes to proceed with the procedure. Written consent was obtained.

Ultrasound was performed to localize and mark an adequate pocket of
fluid in the left chest. The area was then prepped and draped in the
normal sterile fashion. 1% Lidocaine was used for local anesthesia.
Under ultrasound guidance a Safe-T-Centesis catheter was introduced.
Thoracentesis was performed. The catheter was removed and a dressing
applied.
FINDINGS: A total of approximately 0.55 L of thin, brown fluid was removed.
Samples were sent to the laboratory as requested by the clinical
team.
IMPRESSION: Successful ultrasound guided left thoracentesis yielding 0.55 L of
pleural fluid.

## 2023-01-28 DEATH — deceased
